# Patient Record
Sex: Female | Born: 1980 | Race: Black or African American | Hispanic: No | Marital: Married | Smoking: Former smoker
Health system: Southern US, Community
[De-identification: ages and names within clinical notes are randomized; demographics above are authoritative.]

## PROBLEM LIST (undated history)

## (undated) DIAGNOSIS — I10 Essential (primary) hypertension: Secondary | ICD-10-CM

## (undated) DIAGNOSIS — N200 Calculus of kidney: Secondary | ICD-10-CM

## (undated) DIAGNOSIS — R51 Headache: Secondary | ICD-10-CM

## (undated) DIAGNOSIS — D649 Anemia, unspecified: Secondary | ICD-10-CM

## (undated) DIAGNOSIS — B373 Candidiasis of vulva and vagina: Secondary | ICD-10-CM

## (undated) DIAGNOSIS — D72829 Elevated white blood cell count, unspecified: Secondary | ICD-10-CM

## (undated) DIAGNOSIS — B3731 Acute candidiasis of vulva and vagina: Secondary | ICD-10-CM

## (undated) DIAGNOSIS — K219 Gastro-esophageal reflux disease without esophagitis: Secondary | ICD-10-CM

## (undated) DIAGNOSIS — A499 Bacterial infection, unspecified: Secondary | ICD-10-CM

## (undated) DIAGNOSIS — N390 Urinary tract infection, site not specified: Secondary | ICD-10-CM

## (undated) DIAGNOSIS — F419 Anxiety disorder, unspecified: Secondary | ICD-10-CM

## (undated) HISTORY — DX: Anemia, unspecified: D64.9

## (undated) HISTORY — PX: CHOLECYSTECTOMY: SHX55

## (undated) HISTORY — PX: OTHER SURGICAL HISTORY: SHX169

## (undated) HISTORY — DX: Candidiasis of vulva and vagina: B37.3

## (undated) HISTORY — DX: Acute candidiasis of vulva and vagina: B37.31

## (undated) HISTORY — PX: DENTAL SURGERY: SHX609

## (undated) HISTORY — DX: Elevated white blood cell count, unspecified: D72.829

## (undated) HISTORY — PX: TUBAL LIGATION: SHX77

---

## 1998-11-25 ENCOUNTER — Ambulatory Visit (HOSPITAL_COMMUNITY): Admission: RE | Admit: 1998-11-25 | Discharge: 1998-11-25 | Payer: Self-pay | Admitting: Obstetrics

## 1999-02-02 ENCOUNTER — Ambulatory Visit (HOSPITAL_COMMUNITY): Admission: RE | Admit: 1999-02-02 | Discharge: 1999-02-02 | Payer: Self-pay | Admitting: *Deleted

## 1999-03-29 ENCOUNTER — Ambulatory Visit (HOSPITAL_COMMUNITY): Admission: RE | Admit: 1999-03-29 | Discharge: 1999-03-29 | Payer: Self-pay | Admitting: *Deleted

## 1999-04-06 ENCOUNTER — Inpatient Hospital Stay (HOSPITAL_COMMUNITY): Admission: AD | Admit: 1999-04-06 | Discharge: 1999-04-06 | Payer: Self-pay | Admitting: *Deleted

## 1999-04-11 ENCOUNTER — Inpatient Hospital Stay (HOSPITAL_COMMUNITY): Admission: AD | Admit: 1999-04-11 | Discharge: 1999-04-11 | Payer: Self-pay | Admitting: Obstetrics & Gynecology

## 1999-04-14 ENCOUNTER — Inpatient Hospital Stay (HOSPITAL_COMMUNITY): Admission: AD | Admit: 1999-04-14 | Discharge: 1999-04-14 | Payer: Self-pay | Admitting: Obstetrics

## 1999-04-18 ENCOUNTER — Inpatient Hospital Stay (HOSPITAL_COMMUNITY): Admission: AD | Admit: 1999-04-18 | Discharge: 1999-04-18 | Payer: Self-pay | Admitting: Obstetrics & Gynecology

## 1999-04-26 ENCOUNTER — Inpatient Hospital Stay (HOSPITAL_COMMUNITY): Admission: AD | Admit: 1999-04-26 | Discharge: 1999-04-26 | Payer: Self-pay | Admitting: *Deleted

## 1999-05-06 ENCOUNTER — Inpatient Hospital Stay (HOSPITAL_COMMUNITY): Admission: AD | Admit: 1999-05-06 | Discharge: 1999-05-06 | Payer: Self-pay | Admitting: Obstetrics & Gynecology

## 1999-05-08 ENCOUNTER — Inpatient Hospital Stay (HOSPITAL_COMMUNITY): Admission: AD | Admit: 1999-05-08 | Discharge: 1999-05-08 | Payer: Self-pay | Admitting: *Deleted

## 1999-05-11 ENCOUNTER — Inpatient Hospital Stay (HOSPITAL_COMMUNITY): Admission: AD | Admit: 1999-05-11 | Discharge: 1999-05-11 | Payer: Self-pay | Admitting: *Deleted

## 1999-05-13 ENCOUNTER — Encounter (HOSPITAL_COMMUNITY): Admission: RE | Admit: 1999-05-13 | Discharge: 1999-05-14 | Payer: Self-pay | Admitting: Obstetrics & Gynecology

## 1999-05-13 ENCOUNTER — Inpatient Hospital Stay (HOSPITAL_COMMUNITY): Admission: AD | Admit: 1999-05-13 | Discharge: 1999-05-16 | Payer: Self-pay | Admitting: Obstetrics & Gynecology

## 1999-05-18 ENCOUNTER — Inpatient Hospital Stay (HOSPITAL_COMMUNITY): Admission: AD | Admit: 1999-05-18 | Discharge: 1999-05-21 | Payer: Self-pay | Admitting: *Deleted

## 1999-11-15 ENCOUNTER — Encounter: Payer: Self-pay | Admitting: Emergency Medicine

## 1999-11-15 ENCOUNTER — Emergency Department (HOSPITAL_COMMUNITY): Admission: EM | Admit: 1999-11-15 | Discharge: 1999-11-15 | Payer: Self-pay | Admitting: Emergency Medicine

## 2001-03-20 ENCOUNTER — Other Ambulatory Visit: Admission: RE | Admit: 2001-03-20 | Discharge: 2001-03-20 | Payer: Self-pay | Admitting: *Deleted

## 2001-08-06 ENCOUNTER — Emergency Department (HOSPITAL_COMMUNITY): Admission: EM | Admit: 2001-08-06 | Discharge: 2001-08-06 | Payer: Self-pay | Admitting: *Deleted

## 2002-11-12 ENCOUNTER — Other Ambulatory Visit: Admission: RE | Admit: 2002-11-12 | Discharge: 2002-11-12 | Payer: Self-pay | Admitting: Obstetrics and Gynecology

## 2003-03-27 ENCOUNTER — Inpatient Hospital Stay (HOSPITAL_COMMUNITY): Admission: AD | Admit: 2003-03-27 | Discharge: 2003-03-27 | Payer: Self-pay | Admitting: Obstetrics and Gynecology

## 2003-06-12 ENCOUNTER — Inpatient Hospital Stay (HOSPITAL_COMMUNITY): Admission: AD | Admit: 2003-06-12 | Discharge: 2003-06-12 | Payer: Self-pay | Admitting: *Deleted

## 2003-06-22 ENCOUNTER — Encounter (INDEPENDENT_AMBULATORY_CARE_PROVIDER_SITE_OTHER): Payer: Self-pay | Admitting: Specialist

## 2003-06-22 ENCOUNTER — Inpatient Hospital Stay (HOSPITAL_COMMUNITY): Admission: AD | Admit: 2003-06-22 | Discharge: 2003-06-24 | Payer: Self-pay | Admitting: Obstetrics & Gynecology

## 2003-07-30 ENCOUNTER — Other Ambulatory Visit: Admission: RE | Admit: 2003-07-30 | Discharge: 2003-07-30 | Payer: Self-pay | Admitting: Obstetrics and Gynecology

## 2003-12-13 ENCOUNTER — Emergency Department (HOSPITAL_COMMUNITY): Admission: EM | Admit: 2003-12-13 | Discharge: 2003-12-14 | Payer: Self-pay | Admitting: Emergency Medicine

## 2004-02-10 ENCOUNTER — Ambulatory Visit (HOSPITAL_COMMUNITY): Admission: RE | Admit: 2004-02-10 | Discharge: 2004-02-10 | Payer: Self-pay | Admitting: Gastroenterology

## 2004-02-25 ENCOUNTER — Emergency Department (HOSPITAL_COMMUNITY): Admission: EM | Admit: 2004-02-25 | Discharge: 2004-02-25 | Payer: Self-pay | Admitting: Emergency Medicine

## 2004-06-28 ENCOUNTER — Ambulatory Visit (HOSPITAL_COMMUNITY): Admission: RE | Admit: 2004-06-28 | Discharge: 2004-06-28 | Payer: Self-pay | Admitting: Family Medicine

## 2004-10-06 ENCOUNTER — Encounter (INDEPENDENT_AMBULATORY_CARE_PROVIDER_SITE_OTHER): Payer: Self-pay | Admitting: Specialist

## 2004-10-07 ENCOUNTER — Inpatient Hospital Stay (HOSPITAL_COMMUNITY): Admission: EM | Admit: 2004-10-07 | Discharge: 2004-10-08 | Payer: Self-pay | Admitting: Emergency Medicine

## 2004-10-26 ENCOUNTER — Ambulatory Visit (HOSPITAL_COMMUNITY): Admission: RE | Admit: 2004-10-26 | Discharge: 2004-10-26 | Payer: Self-pay | Admitting: Gastroenterology

## 2004-10-26 ENCOUNTER — Inpatient Hospital Stay (HOSPITAL_COMMUNITY): Admission: AD | Admit: 2004-10-26 | Discharge: 2004-10-29 | Payer: Self-pay | Admitting: Gastroenterology

## 2005-11-27 ENCOUNTER — Emergency Department (HOSPITAL_COMMUNITY): Admission: EM | Admit: 2005-11-27 | Discharge: 2005-11-27 | Payer: Self-pay | Admitting: Emergency Medicine

## 2006-11-17 ENCOUNTER — Emergency Department (HOSPITAL_COMMUNITY): Admission: EM | Admit: 2006-11-17 | Discharge: 2006-11-17 | Payer: Self-pay | Admitting: Emergency Medicine

## 2007-05-28 ENCOUNTER — Emergency Department (HOSPITAL_COMMUNITY): Admission: EM | Admit: 2007-05-28 | Discharge: 2007-05-28 | Payer: Self-pay | Admitting: Emergency Medicine

## 2009-01-21 ENCOUNTER — Inpatient Hospital Stay (HOSPITAL_COMMUNITY): Admission: AD | Admit: 2009-01-21 | Discharge: 2009-01-21 | Payer: Self-pay | Admitting: Obstetrics and Gynecology

## 2009-02-12 ENCOUNTER — Ambulatory Visit (HOSPITAL_COMMUNITY): Admission: RE | Admit: 2009-02-12 | Discharge: 2009-02-12 | Payer: Self-pay | Admitting: Obstetrics and Gynecology

## 2009-03-12 ENCOUNTER — Ambulatory Visit (HOSPITAL_COMMUNITY): Admission: RE | Admit: 2009-03-12 | Discharge: 2009-03-12 | Payer: Self-pay | Admitting: Obstetrics and Gynecology

## 2009-04-09 ENCOUNTER — Encounter: Admission: RE | Admit: 2009-04-09 | Discharge: 2009-07-08 | Payer: Self-pay | Admitting: Obstetrics and Gynecology

## 2009-07-16 ENCOUNTER — Inpatient Hospital Stay (HOSPITAL_COMMUNITY): Admission: AD | Admit: 2009-07-16 | Discharge: 2009-07-19 | Payer: Self-pay | Admitting: Obstetrics and Gynecology

## 2009-07-17 ENCOUNTER — Encounter (INDEPENDENT_AMBULATORY_CARE_PROVIDER_SITE_OTHER): Payer: Self-pay | Admitting: Obstetrics and Gynecology

## 2010-04-03 ENCOUNTER — Encounter: Payer: Self-pay | Admitting: Emergency Medicine

## 2010-04-24 IMAGING — US US OB FOLLOW-UP
1 series · 14 of 28 positions shown · non-contrast
Comparison: none

OBSTETRICAL ULTRASOUND:
 This ultrasound was performed in The [HOSPITAL], and the [facility] report will be stored to [REDACTED] PACS.  This report is also available in [HOSPITAL]?s accessANYware.

[Series 1: us ob follow-up · 58 acquisitions, 14 frames shown]
[im 3/58]
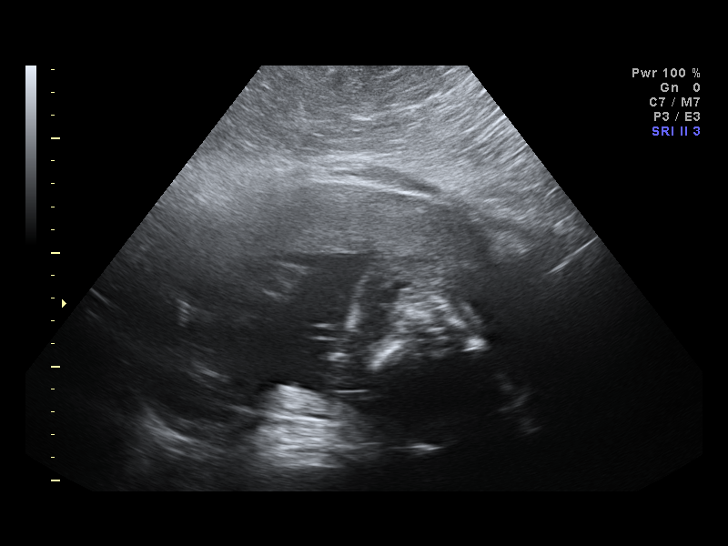
[im 7/58]
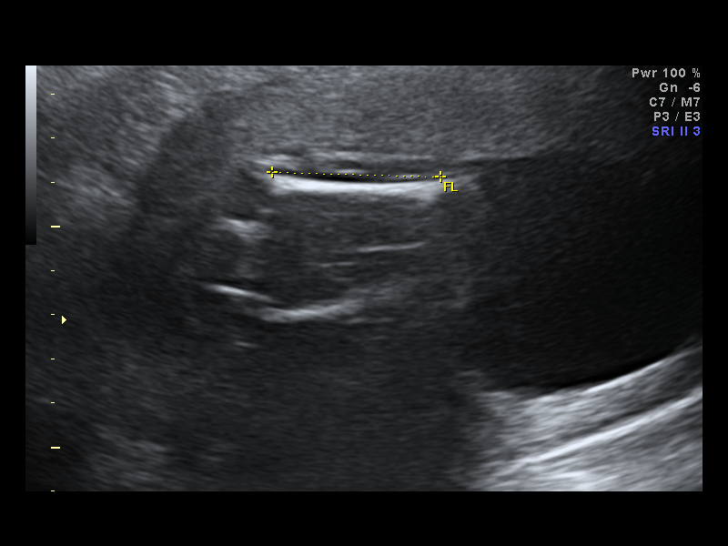
[im 11/58]
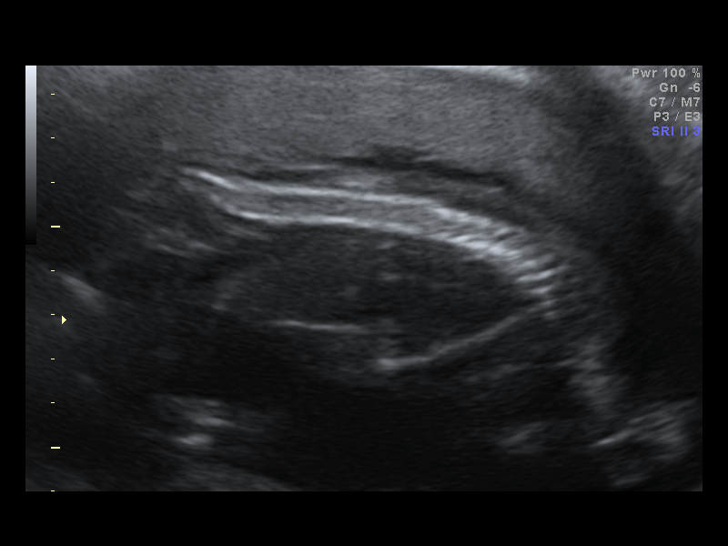
[im 15/58]
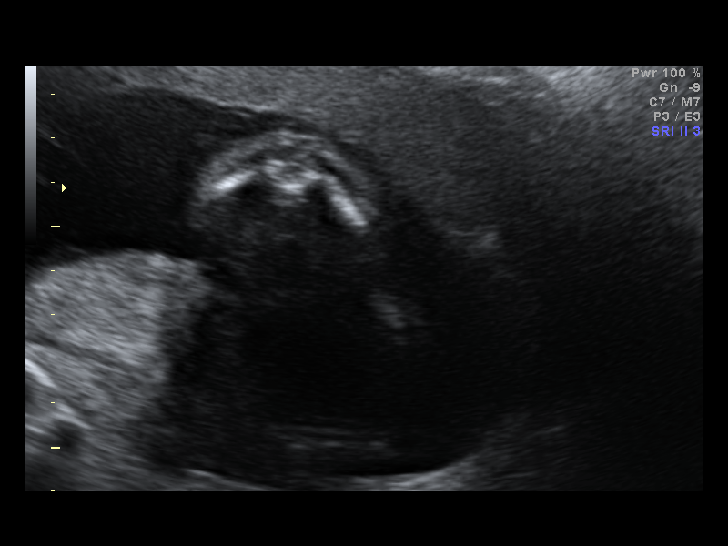
[im 20/58]
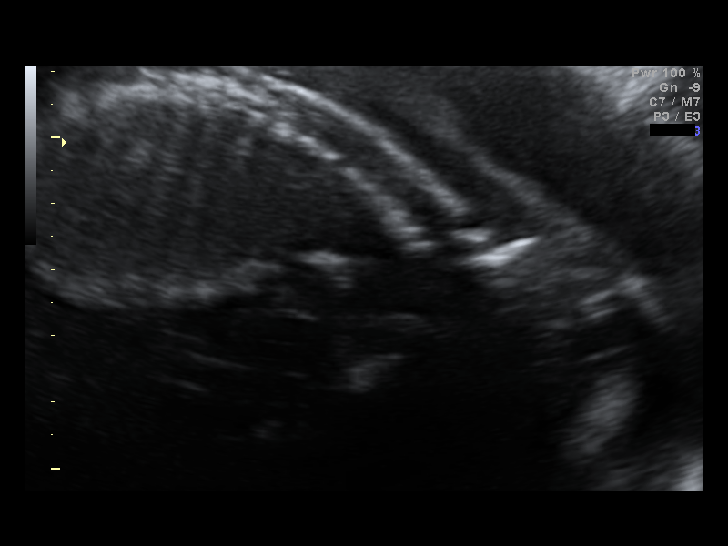
[im 24/58]
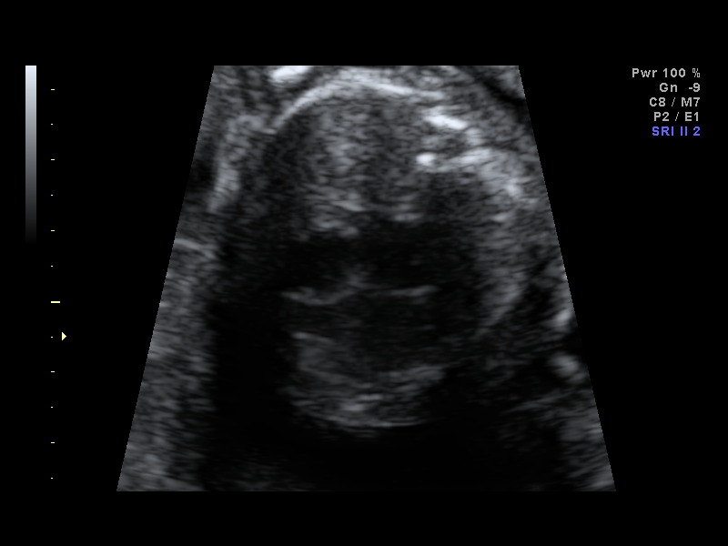
[im 28/58]
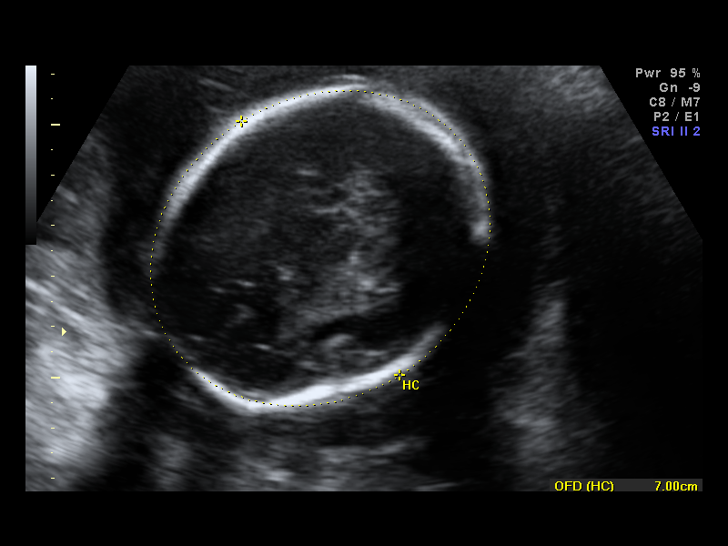
[im 32/58]
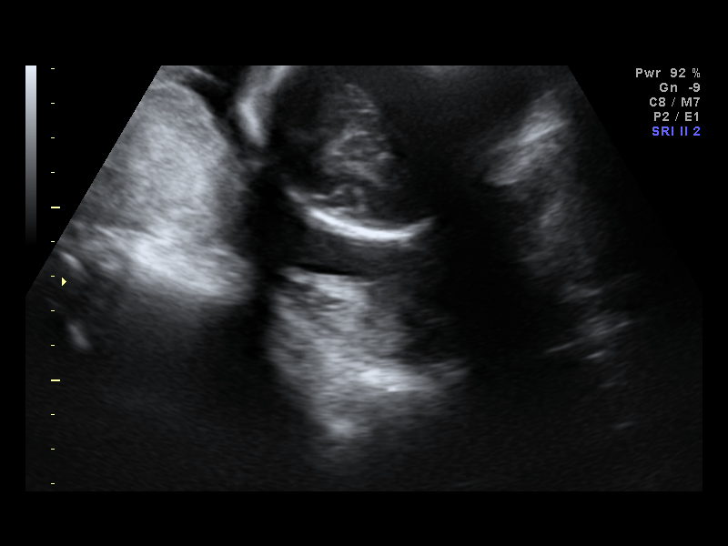
[im 36/58]
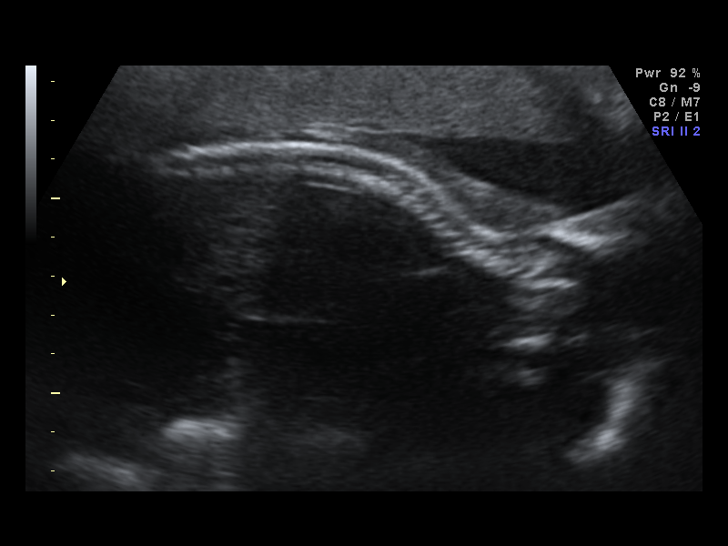
[im 41/58]
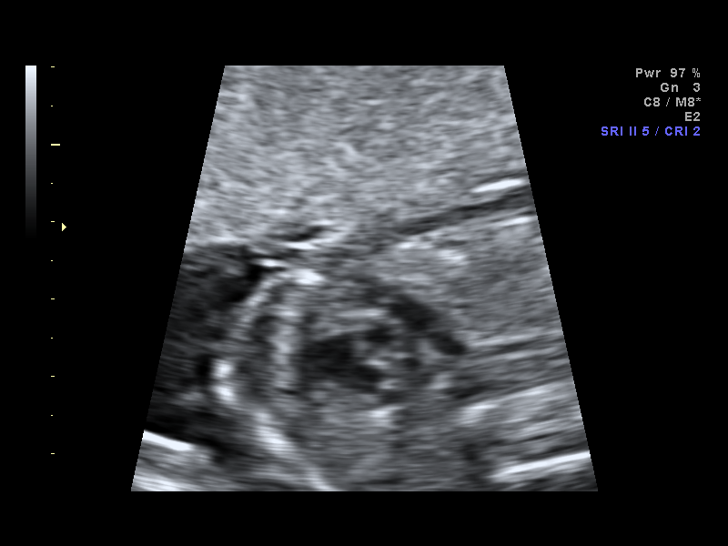
[im 45/58]
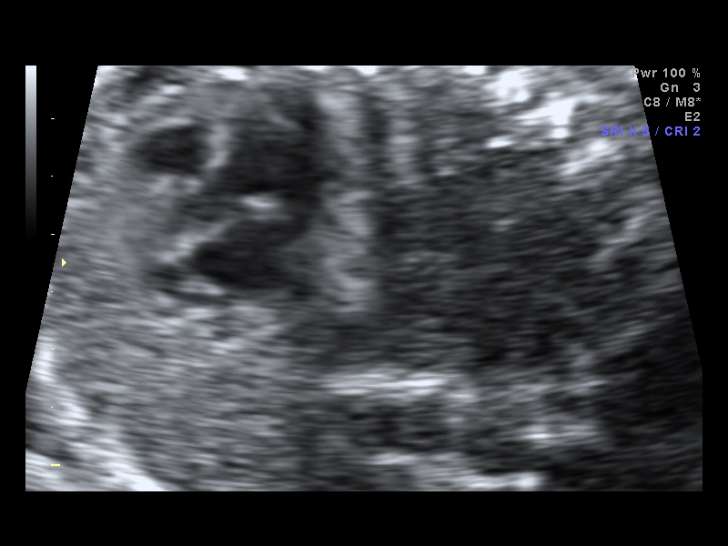
[im 49/58]
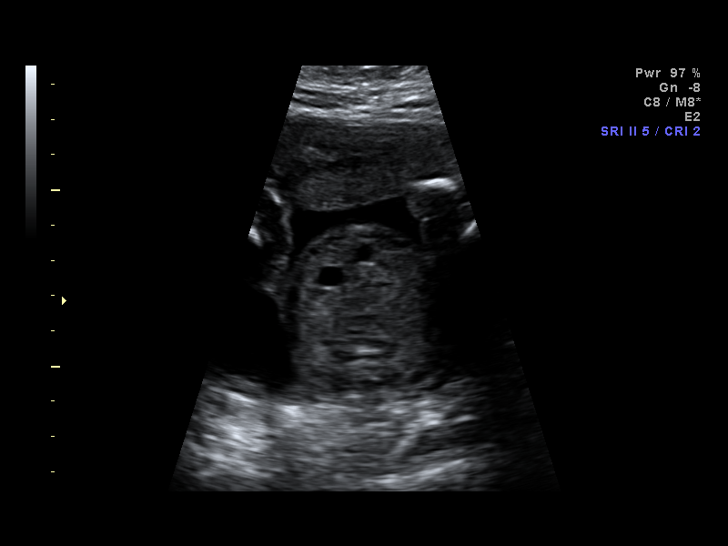
[im 53/58]
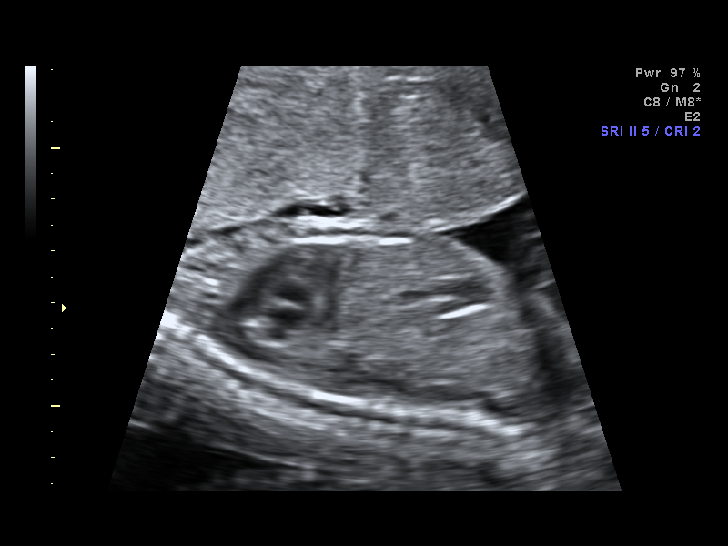
[im 58/58]
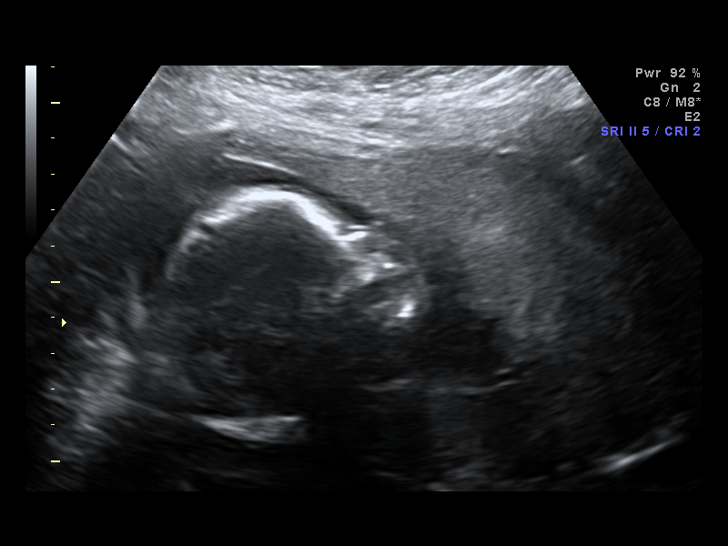

[14 of 28 positions shown; findings below may reference images not displayed]

IMPRESSION: [facility] has also been faxed to the ordering physician.

## 2010-06-01 LAB — COMPREHENSIVE METABOLIC PANEL
ALT: 15 U/L (ref 0–35)
ALT: 18 U/L (ref 0–35)
AST: 23 U/L (ref 0–37)
Alkaline Phosphatase: 98 U/L (ref 39–117)
BUN: 5 mg/dL — ABNORMAL LOW (ref 6–23)
BUN: 7 mg/dL (ref 6–23)
CO2: 21 mEq/L (ref 19–32)
CO2: 25 mEq/L (ref 19–32)
Calcium: 7.1 mg/dL — ABNORMAL LOW (ref 8.4–10.5)
Chloride: 103 mEq/L (ref 96–112)
Chloride: 106 mEq/L (ref 96–112)
Chloride: 108 mEq/L (ref 96–112)
Creatinine, Ser: 0.56 mg/dL (ref 0.4–1.2)
Creatinine, Ser: 0.6 mg/dL (ref 0.4–1.2)
GFR calc Af Amer: >60 mL/min (ref >60)
GFR calc non Af Amer: >60 mL/min (ref >60)
GFR calc non Af Amer: >60 mL/min (ref >60)
Glucose, Bld: 106 mg/dL — ABNORMAL HIGH (ref 70–99)
Glucose, Bld: 73 mg/dL (ref 70–99)
Potassium: 3.5 mEq/L (ref 3.5–5.1)
Sodium: 133 mEq/L — ABNORMAL LOW (ref 135–145)
Sodium: 135 mEq/L (ref 135–145)
Total Bilirubin: 0.2 mg/dL — ABNORMAL LOW (ref 0.3–1.2)
Total Bilirubin: 0.3 mg/dL (ref 0.3–1.2)
Total Bilirubin: 0.4 mg/dL (ref 0.3–1.2)
Total Protein: 4.7 g/dL — ABNORMAL LOW (ref 6.0–8.3)

## 2010-06-01 LAB — CBC
HCT: 29.1 % — ABNORMAL LOW (ref 36.0–46.0)
HCT: 29.7 % — ABNORMAL LOW (ref 36.0–46.0)
HCT: 37.6 % (ref 36.0–46.0)
Hemoglobin: 12.8 g/dL (ref 12.0–15.0)
Hemoglobin: 12.9 g/dL (ref 12.0–15.0)
Hemoglobin: 9.9 g/dL — ABNORMAL LOW (ref 12.0–15.0)
MCHC: 33.7 g/dL (ref 30.0–36.0)
MCV: 89.2 fL (ref 78.0–100.0)
MCV: 89.7 fL (ref 78.0–100.0)
Platelets: 155 10*3/uL (ref 150–400)
Platelets: 163 10*3/uL (ref 150–400)
Platelets: 202 10*3/uL (ref 150–400)
RBC: 3.31 MIL/uL — ABNORMAL LOW (ref 3.87–5.11)
RBC: 4.21 MIL/uL (ref 3.87–5.11)
RBC: 4.22 MIL/uL (ref 3.87–5.11)
RDW: 16.3 % — ABNORMAL HIGH (ref 11.5–15.5)
WBC: 14 10*3/uL — ABNORMAL HIGH (ref 4.0–10.5)
WBC: 19 10*3/uL — ABNORMAL HIGH (ref 4.0–10.5)
WBC: 19.1 10*3/uL — ABNORMAL HIGH (ref 4.0–10.5)
WBC: 20.1 10*3/uL — ABNORMAL HIGH (ref 4.0–10.5)

## 2010-06-01 LAB — URINALYSIS, ROUTINE W REFLEX MICROSCOPIC
Glucose, UA: NEGATIVE mg/dL
Ketones, ur: NEGATIVE mg/dL
Protein, ur: NEGATIVE mg/dL

## 2010-06-01 LAB — CREATININE, URINE, 24 HOUR
Collection Interval-UCRE24: 24 hours
Creatinine, 24H Ur: 1657 mg/d (ref 700–1800)

## 2010-06-01 LAB — LACTATE DEHYDROGENASE
LDH: 124 U/L (ref 94–250)
LDH: 144 U/L (ref 94–250)

## 2010-06-01 LAB — PROTEIN, URINE, 24 HOUR
Protein, 24H Urine: 312 mg/d — ABNORMAL HIGH (ref 50–100)
Protein, Urine: 14 mg/dL

## 2010-06-01 LAB — URIC ACID: Uric Acid, Serum: 4.1 mg/dL (ref 2.4–7.0)

## 2010-06-01 LAB — URINE MICROSCOPIC-ADD ON

## 2010-06-16 LAB — WET PREP, GENITAL
Clue Cells Wet Prep HPF POC: NONE SEEN
Trich, Wet Prep: NONE SEEN
Yeast Wet Prep HPF POC: NONE SEEN

## 2010-06-16 LAB — URINE CULTURE

## 2010-06-16 LAB — URINALYSIS, ROUTINE W REFLEX MICROSCOPIC
Ketones, ur: NEGATIVE mg/dL
Nitrite: NEGATIVE
Protein, ur: NEGATIVE mg/dL
Urobilinogen, UA: 0.2 mg/dL (ref 0.0–1.0)

## 2010-06-16 LAB — URINE MICROSCOPIC-ADD ON

## 2010-07-30 NOTE — Discharge Summary (Signed)
[facility]  Patient:    Lindsay Pineda, Lindsay Pineda                    MRN: [id number] Adm. Date:  [id number] Disc. Date: [id number] Attending:  Michaelle Copas Dictator:   Brandt Loosen, M.D.                           Discharge Summary  DISCHARGE DIAGNOSES:          Undelivered intrauterine pregnancy at 40-4/7 weeks.  DISCHARGE MEDICATIONS:        Prenatal vitamins.  Routine labor instructions.  HOSPITAL COURSE:              Intrauterine pregnancy at 40-4/7 weeks.  The patient was admitted with elevated blood pressures.  Uric acid of 5.2.  She was begun on low dose Pitocin for induction of labor for PIH.  She was then placed upon Cytotec overnight.  There was no change in her cervix.  PIH labs were followed and remained within normal limits.  The patient desired to go home and return for follow-up induction.  NST was reassuring.  The patient was discharged to home with routine follow-up and care. DD:  08/27/99 TD:  08/31/99 Job: [id number]

## 2010-07-30 NOTE — Op Note (Signed)
NAME:  Lindsay Pineda, Lindsay Pineda         ACCOUNT NO.:  [id number]   MEDICAL RECORD NO.:  [id number]          PATIENT TYPE:  AMB   LOCATION:  ENDO                         FACILITY:  [facility]   PHYSICIAN:  [place] Hawks. Elnoria Howard, MD    DATE OF BIRTH:  05-29-1980   DATE OF PROCEDURE:  02/10/2004  DATE OF DISCHARGE:                                 OPERATIVE REPORT   PROCEDURE:  Colonoscopy.   INDICATION:  Hematochezia and abdominal pain as well as a strong family  history of colon cancer.   REFERRING PHYSICIAN:  Lenoard Aden, M.D.   ENDOSCOPIST:  [place] Hawks. Elnoria Howard, M.D.   EQUIPMENT USED:  Adult Olympus colonoscope.   CONSENT:  Informed consent was obtained from the patient describing the  risks of bleeding, infection, perforation, medication reactions, the 10%  miss rate for a small colon cancer or polyp, and the risk of death, all of  which are not exclusive of any other complications that may occur.   PHYSICAL EXAMINATION:  CARDIAC:  Regular rate and rhythm.  LUNGS:  Clear to auscultation bilaterally.  ABDOMEN:  Obese, soft, nontender, nondistended.   MEDICATIONS:  Versed 10 mg IV, Demerol 100 mg IV.   DESCRIPTION OF PROCEDURE:  After adequate sedation was achieved with  medications, a rectal examination was performed which was negative for any  palpable abnormality.  The colonoscope was then introduced from the anus and  advanced under direct visualization to the terminal ileum without  difficulty.  The patient was noted to have an excellent prep;  however,  within the sigmoid colon, this was improved by multiple washings, and  excellent views of the mucosa were obtained.  Upon slow withdrawal from the  cecum, there was no evidence of any polyps, masses, inflammation,  ulcerations, erosions, diverticula, or vascular abnormalities in the cecum,  ascending, transverse, descending, or sigmoid colon.  Retroflexion in the  rectum did reveal external hemorrhoids which were moderate in size.   Photo  documentation of the cecum and terminal ileum was also obtained.  The  colonoscope was then straightened and withdrawn from the patient, and the  procedure was terminated.  The patient also tolerated the procedure well.   PLAN:  1.  Repeat colonoscopy in five years.  2.  Follow up in clinic as previously scheduled.  3.  Continue with a high fiber diet.       PDH/MEDQ  D:  02/10/2004  T:  02/10/2004  Job:  [id number]   cc:   Lenoard Aden, M.D.  [address]  [place]  [place] [address]  Fax: [phone]

## 2010-07-30 NOTE — H&P (Signed)
NAMEMarland Pineda  Lindsay, [name] NO.:  [id number]   MEDICAL RECORD NO.:  [id number]          PATIENT TYPE:  INP   LOCATION:  5707                         FACILITY:  [facility]   PHYSICIAN:  Hillery Aldo, M.D.   DATE OF BIRTH:  Jul 04, 1980   DATE OF ADMISSION:  10/26/2004  DATE OF DISCHARGE:                                HISTORY & PHYSICAL   PRIMARY CARE PHYSICIAN:  Lindsay Pineda. [place] Pineda, M.D.   GASTROENTEROLOGIST:  Anselmo Rod, M.D.   CHIEF COMPLAINT:  Back pain and right flank pain.   HISTORY OF PRESENT ILLNESS:  The patient is a 30 year old female with a  history of cholelithiasis status post cholecystectomy on October 06, 2004, and  now status post ERCP x2, the last one being today.  The patient states she  had a stent placed by Dr. Elnoria Howard.  She has had pain unrelieved by Vicodin and  is admitted for pain control and further workup of her abdominal pain.  The  patient claims she is also nauseated but denies any vomiting.  Denies any  fever or chills.  No changes in her appetite.  No changes in her bowel  habits.  No melena or hematochezia.   PAST MEDICAL HISTORY:  1.  Cholelithiasis status post cholecystectomy on October 06, 2004, status post      ERCP x2 with stent placement.  2.  Obesity.  3.  Tobacco abuse.   ALLERGIES:  No known drug allergies.   MEDICATIONS:  Yasmin daily.   SOCIAL HISTORY:  The patient is married.  She is employed as a Public librarian at [employer].  She has completed some  college.  She smokes 1/2 pack cigarettes daily for 9 years and uses alcohol  on social occasions.  Denies any street drugs.   FAMILY HISTORY:  No history of gallbladder disease in the family.  She has a  grandfather who has colon cancer.  Her mother is alive and well.  Her father  is alive and has diabetes.   REVIEW OF SYSTEMS:  As per HPI.  Otherwise, no dysuria, no chest pain, no  shortness of breath.  She does have occasional cough that is  nonproductive.  She has had steady weight gain over time.  All systems otherwise reviewed  and found to be negative.   PHYSICAL EXAMINATION:  VITAL SIGNS: Temperature 99.1, pulse 70, respirations  20, blood pressure 126/79, O2 saturation 98% on room air.  GENERAL:  Obese female in no apparent distress.  HEENT:  Normocephalic and atraumatic.  PERRL.  EOMI.  Oropharynx is clear.  She does have a tongue piercing.  NECK:  Supple, no thyromegaly, no lymphadenopathy, no jugular venous  distention.  CHEST: Lungs clear to auscultation bilaterally with good air movement.  HEART:  Regular rate and rhythm.  No murmurs, rubs, or gallops.  ABDOMEN: Soft, nontender, nondistended, with normoactive bowel sounds in all  four quadrants.  EXTREMITIES:  No edema, cyanosis, clubbing.  SKIN:  No rashes.  NEUROLOGIC:  Nonfocal.   LABORATORY DATA:  Pending at time of this dictation.   ASSESSMENT AND PLAN:  1.  Abdominal  pain status post endoscopic retrograde      cholangiopancreatography with stent placement.  Will check CMET, CBC, as      well as lipase to rule out pancreatitis, infection, or other obstructive      pathology.  Will also get a CT scan of the abdomen and pelvis for a      better look into her anatomy.  In the meantime, will achieve pain      control with Percocet and, if needed, IV morphine and antiemetics.  Dr.      Loreta Ave is aware of the patient's admission, and she will be seen by      gastroenterology in the morning.  2.  Tobacco abuse.  Will encourage tobacco cessation with appropriate      teaching.           ______________________________  Hillery Aldo, M.D.     CR/MEDQ  D:  10/26/2004  T:  10/26/2004  Job:  [id number]   cc:   Lindsay Pineda. Lindsay Pineda, M.D.  Fax: [phone]   [id number] NWG NFAO, M.D.  [address]  [address]  [place]  [place] [address]  Fax: [phone]

## 2010-07-30 NOTE — H&P (Signed)
NAME:  ARMONII, SIEH NO.:  [id number]   MEDICAL RECORD NO.:  [id number]                   PATIENT TYPE:  INP   LOCATION:  9163                                 FACILITY:  [facility]   PHYSICIAN:  Richardean Sale, M.D.                DATE OF BIRTH:  21-Mar-1980   DATE OF ADMISSION:  06/22/2003  DATE OF DISCHARGE:                                HISTORY & PHYSICAL   CHIEF COMPLAINT:  Contractions.   HISTORY OF PRESENT ILLNESS:  A 30 year old gravida 2, para 1 African-  American female at 39-6/[redacted] weeks gestation with [facility] of June 23, 2003  presents to Labor and Delivery complaining of contractions.  The patient  rates her pain as an 8 out of 10.  Pregnancy has been complicated by fundal  height discrepancy.  Last ultrasound per the patient's report was estimated  fetal weight of 7 pounds 2 to 3 weeks ago.  At 33 weeks, estimated fetal  weight was 2262 grams which was 55th percentile.  Pregnancy has otherwise  been uncomplicated.  The patient was scheduled for an induction tomorrow  morning.   PAST OBSTETRIC HISTORY:  Significant for a macrosomic fetus weighting over 9-  1/2 pounds with a successful vaginal delivery.  She also has a history of  oligohydramnios, pregnancy-induced hypertension, and positive group B beta  strep in her last pregnancy.  The patient reports good fetal movement,  denies loss of fluid or vaginal bleeding at the present.   PAST MEDICAL HISTORY:  History of pyelonephritis as a child.   PAST SURGICAL HISTORY:  None.   OBSTETRIC HISTORY:  Vaginal delivery x1.  Infant weighing over 9-1/2 pounds  at [redacted] weeks gestation.  Pregnancy complicated by positive group B beta  strep, pregnancy-induced hypertension, and oligohydramnios.   PAST GYNECOLOGICAL HISTORY:  Positive history of Trichomonas.  Denies any  history of herpes.  Menses began at age 47, have always been irregular,  lasts 3 to 7 days.  Estimated due date was determined by ultrasound  given  history of irregular cycles.   PHYSICAL EXAMINATION:  VITAL SIGNS:  She is afebrile.  Blood pressure is  138/85, all other vital signs are stable.  GENERAL:  She is an obese African-American female who appears uncomfortable  with contractions but is in no acute distress.  HEART:  Regular rate and rhythm, 3/6 systolic ejection murmur noted.  LUNGS:  Clear to auscultation bilaterally.  ABDOMEN:  Fundal height is 42.  Abdomen is gravid and nontender.  Contractions palpates moderate.  EXTREMITIES:  Trace edema bilaterally, no cyanosis or clubbing.  Nontender.  PELVIC:  Cervix is 4 to 5 cm dilated, 80% effaced, minus 1 station and  vertex.  FETAL MONITORING:  Fetal heart tones 140s to 150s.  No decelerations noted.  Very difficult to trace due the patient's moving around in bed with  contractions and the patient's habitus.  Tocometer contractions  approximately every 3  to 5 minutes.  Again, difficult to trace due to the  patient's habitus.   PRENATAL LABORATORY DATA:  Group B beta strep unknown.  HIV nonreactive.  Triple test within normal limits.  Blood type O positive, antibody screen  negative, RPR nonreactive, rubella immune, hepatitis B surface antigen  negative.  One-hour Glucola within normal limits.  The patient's value not  recorded on the patient's prenatal forms.  The patient denies having a three-  hour glucose tolerance test.   ASSESSMENT:  A 30 year old gravida 2, para 1 black female who is at 39-6/[redacted]  weeks gestation in labor.   PLAN:  1. Admit to Labor and Delivery.  2. Continuous fetal monitoring.  3. The patient may have Stadol for pain relief and for epidural.  4. Unknown group B beta strep status with history of group B strep in     previous pregnancy.  Will treat with penicillin for prophylaxis and     perform amniotomy after penicillin has been administered unless fetal     heart rate tracing is nonreassuring and a scalp electrode needs to be     placed.   5. History of macrosomic infant.  The patient reports that her one-hour     Glucola test was within normal limits.  She had an ultrasound     approximately 2 to 3 weeks ago where she said the baby was 7 pounds.  She     has a history of a 9-1/2 pound infant.  Discussed with the patient the     possibility of shoulder dystocia, given both the estimated size of the     infant, her past history, as well as the patient's habitus.  Explained to     her that with a shoulder dystocia, there is a possibility of a brachial     plexus injury or possibility of hypoxemia to the fetus that could result     in (1) permanent nerve damage and paralysis of the arm, or if hypoxia     occurs (2) possibility of brain damage and cerebral palsy, (3) also     possibility of fourth-degree episiotomy or lacerations with resulting     difficulty with bowel movements.  The patient expresses understanding of     the risks at the time of admission.                                               Richardean Sale, M.D.    JW/MEDQ  D:  06/22/2003  T:  06/22/2003  Job:  [id number]

## 2010-07-30 NOTE — Consult Note (Signed)
NAMEMarland Kitchen  Lindsay Pineda NO.:  [id number]   MEDICAL RECORD NO.:  [id number]          PATIENT TYPE:  INP   LOCATION:  5707                         FACILITY:  [facility]   PHYSICIAN:  [place] Hawks. Elnoria Howard, MD    DATE OF BIRTH:  06-07-1980   DATE OF CONSULTATION:  10/27/2004  DATE OF DISCHARGE:                                   CONSULTATION   REFERRING TEAM:  [facility] Team.   REASON FOR CONSULTATION:  Abdominal pain, status post ERCP.   ALLERGIES:  No known drug allergies.   MEDICATIONS:  Jasmine daily.   SOCIAL HISTORY:  The patient is married, has a smoking history of 1-1/2  packs per day x9 years.  She has social alcohol use.  No illicit drug use.   FAMILY HISTORY:  Significant for colon cancer in her grandfather and  diabetes.   REVIEW OF SYSTEMS:  Significant for abdominal pain and nausea.  No fevers,  chills, vomiting.  No hematemesis, dysuria or dysarthria, rashes, arthritis,  arthralgias.   HISTORY OF PRESENT ILLNESS:  This is a 30 year old black female with a past  medical history of laparoscopic cholecystectomy approximately 1 month ago  for acute cholecystitis and with retained CBD stones, status post ERCP x2.  The patient initially underwent an ERCP by myself approximately 1 month ago;  however, because of her anatomy, it was difficult to remove the stones.  A  biliary stent was placed in at that time and plans for a repeat ERCP to  extend the sphincterotomy and re-attempt to extract the stones.  During the  second ERCP, it was noted that the patient's distal CBD had an acute angle  which prohibited the clearance of the CBD stones.  Multiple attempts were  made with the balloons and using a wire basket to crush the stones;  unfortunately, these attempts failed.  A replacement stent was placed in the  same size, 10-French, x5 cm into the CBD, which was confirmed to be in  excellent placement, both fluoroscopically and endoscopically.  The  procedure was  terminated without complications and the patient recovered  without any event; however, after being at home for approximately 3-4 hours,  the patient called stating that she had persistent right upper quadrant pain  that also radiated to back.  No reports of any vomiting or nausea.  It was  felt that the patient needed an admission to the hospital for further  evaluation.   PHYSICAL EXAMINATION:  VITAL SIGNS:  Blood pressure is 122/78, heart rate  62, respirations 18, temperature is 97.7, pulse oximetry is 99% on room air.  GENERAL:  The patient is in no acute distress, alert and oriented.  HEENT:  Normocephalic, atraumatic.  Extraocular muscles intact.  Pupils are  equal, round and reactive to light.  NECK:  Supple with no lymphadenopathy.  LUNGS:  Clear to auscultation bilaterally.  CARDIOVASCULAR:  Regular rate and rhythm without murmurs, gallops or rubs.  ABDOMEN:  Obese, soft, tender in the right upper quadrant as well as the  midepigastric region.  No evidence of any rebound or abdominal rigidity.  EXTREMITIES:  No clubbing,  cyanosis, or edema.   LABORATORY VALUES ON ADMISSION:  White blood cell count 17,000, hemoglobin  is 11.8, platelets at 317,000.  Sodium is 140, potassium 3.8, chloride 108,  CO2 28, BUN of 7, creatinine 0.6, glucose is 93.  AST is 20, ALT 21,  alkaline phosphatase 50, total bilirubin 0.5, lipase is 43, albumin is 3.1.   IMPRESSION:  1.  Right upper quadrant pain, status post endoscopic retrograde      cholangiopancreatogram.  2.  Obesity.   After evaluation, the patient is not clear where her source of pain may be  originating.  The patient was replaced with the same size stent as the  initial endoscopic retrograde cholangiopancreatogram and during the  preceding months, she did not have any significant difficulties, although  she reported some minor discomfort with bending over in the right upper  quadrant region.  Placement of the stent was excellent  during the procedure.  There may be a  possibility of stent migration.  The laboratory values are  negative for any evidence of pancreatitis at this time.  The procedure was  long and it was difficult in the attempt to extract the stone and it may be  that she is experiencing some residual pain as a result of the manipulation  of the common bile duct.  I am uncertain why she has an elevated white blood  cell count; there is no evidence of any fever or clinical evidence of overt  cholangitis.  However , she does state that she has a history of elevated  white blood cell count of unknown etiology.   PLAN:  Plan at this time is to:  1.  Obtain an CT scan of the abdomen and pelvis.  2.  Continue with pain control.  3.  She will require surgical consultation in the near future for the      removal of the CBD stones surgically.      [place] Hawks Elnoria Howard, MD  Electronically Signed     PDH/MEDQ  D:  10/27/2004  T:  10/27/2004  Job:  [id number]

## 2010-07-30 NOTE — H&P (Signed)
NAME:  [place] Pineda, Lindsay Pineda NO.:  [id number]   MEDICAL RECORD NO.:  [id number]          PATIENT TYPE:  OBV   LOCATION:  1416                         FAC[facility]   PHYSICIAN:  Lorre Munroe., M.D.DATE OF BIRTH:  09-13-1980   DATE OF ADMISSION:  10/05/2004  DATE OF DISCHARGE:                                HISTORY & PHYSICAL   CHIEF COMPLAINT:  Abdominal pain.   PRESENT ILLNESS:  This is a 30 year old African-American female who has a 1-  day history of severe upper abdominal pain and protracted vomiting. She says  she vomited about 10 times. There has been no fever or diarrhea. She does  not have any chronic GI problems although she does have some reflux  symptomatology now and then. At the emergency room she was found to have a  white count of 18,000. Liver tests were normal. She is found on ultrasound  to have gallstones without evidence of acute cholecystitis, but she did have  a sonographic Murphy's sign. The patient is brought into the hospital have a  cholecystectomy. She consents to have that done.   PAST MEDICAL HISTORY:  Excellent health. Her only medicine is birth control  pills. She has no medicine allergies. She says she is not pregnant. She has  never had any operations. She has a 43-month-old child. The family history  and childhood illnesses are unremarkable. The patient smokes about half a  pack cigarettes a day. She drinks alcoholic beverages occasionally and  moderately.   FIFTEEN-POINT REVIEW OF SYSTEMS:  Unremarkable.   PHYSICAL EXAMINATION:  VITAL SIGNS:  Temperature and vital signs are as  documented by nursing report.  GENERAL:  The patient is obese. No acute distress. Mental status normal.  HEENT:  The head, neck, eyes, ears, nose, mouth and throat are unremarkable  with no enlargement of the thyroid, no thyroid nodule, no neck mass, no  scleral icterus, no abnormalities of the oral mucosa.  CHEST:  Clear to auscultation. No chest  wall tenderness present.  HEART:  Rate and rhythm normal. No murmur or gallop.  ABDOMEN:  Tender in the right upper quadrant, otherwise unremarkable with no  mass, no organomegaly. Few bowel sounds are heard. There is no hernia.  RECTAL AND PELVIC:  Not done.  EXTREMITIES:  There is no edema. There are no skin lesions and the pulses  are good.  SKIN:  No lesions noted.  LYMPH NODES:  None enlarged in the groin, axilla or neck.   IMPRESSION:  Acute cholecystitis and cholelithiasis.   PLAN:  Pain medication, IV fluids, and cholecystectomy later today.     WB/MEDQ  D:  10/06/2004  T:  10/06/2004  Job:  [id number]

## 2010-07-30 NOTE — Op Note (Signed)
NAMEMarland Pineda  Lindsay, Pineda NO.:  [id number]   MEDICAL RECORD NO.:  [id number]          PATIENT TYPE:  OBV   LOCATION:  1416                         FACILITY:  [facility]   PHYSICIAN:  Angelia Mould. Derrell Lolling, M.D.DATE OF BIRTH:  [birth year]   DATE OF PROCEDURE:  10/06/2004  DATE OF DISCHARGE:                                 OPERATIVE REPORT   PREOPERATIVE DIAGNOSES:  Acute cholecystitis with cholelithiasis.   POSTOPERATIVE DIAGNOSIS:  Acute cholecystitis with cholelithiasis,  choledocholithiasis.   OPERATION PERFORMED:  Laparoscopic cholecystectomy with intraoperative  cholangiogram.   SURGEON:  Angelia Mould. Derrell Lolling, M.D.   FIRST ASSISTANT:  Marta Lamas. Lindie Spruce, M.D.   OPERATIVE INDICATIONS:  This is a 30 year old African-American female who  presented to the [facility] emergency room with a 24-hour history of upper  abdominal pain and repeated vomiting.  Denied fever or diarrhea.  Denied chronic GI problems, although she says she has some mild reflux.  She  did not report any GI evaluations in the past.  She was evaluated by the  emergency department and found to have a white blood cell count of 18,000,  normal liver function tests, and on ultrasound found to have gallstones and  a positive sonographic Murphy sign.  She was admitted to hospital and placed  on antibiotics, and she is brought to operating room urgently for a  cholecystectomy.   OPERATIVE FINDINGS:  The gallbladder was acutely inflamed and thick-walled,  with patchy areas of either hemorrhage or gangrene.  The cystic duct was  large and thickened and had a large stone impacted, which we milked out.  The cholangiogram showed multiple medium to small common bile duct stones,  but there was no obstruction and the contrast went onto the duodenum.  The  anatomy of the intrahepatic and extrahepatic biliary tree otherwise looked  normal.  Otherwise, the stomach, liver, duodenum, small intestine, large  intestine, omentum  and the peritoneal surfaces looked normal.   OPERATIVE TECHNIQUE:  Following the induction of general endotracheal  anesthesia, the patient's abdomen was prepped and draped in sterile fashion.  Marcaine 0.5% with epinephrine was used as a local infiltration anesthetic.  A vertically-oriented incision was made just above the umbilicus.  The  fascia was incised in the midline and the abdominal cavity entered under  direct vision.  A 10 mm Hasson trocar was inserted and secured with a  pursestring suture of 0 Vicryl.  Pneumoperitoneum was created.  The video  camera was inserted with visualization and findings as described above.  A  10 mm trocar was placed in the subxiphoid region and two 5 mm trocars placed  in the right midabdomen.  A 5 mm suction trocar was used to penetrate the  fundus of the gallbladder and aspirate the bile, and this collapsed the  gallbladder enough to where we could grasp it.  We then took some adhesions  down off of the infundibulum.  We dissected out the cystic duct and the cystic artery.  We isolated the  cystic artery, secured it with multiple metal clips and divided it.  We very  carefully dissected a large window behind  the cystic duct and when we were  certain of our anatomy, we placed a clip on the cystic duct close the  gallbladder.  We finally we found a large stone in the distal cystic duct and milked that  back and out of the opening in the cystic duct.  We then placed a  cholangiogram catheter.  A cholangiogram was obtained using the C-arm.  This  showed normal intrahepatic and extrahepatic biliary anatomy, a somewhat  dilated system, good flow of contrast into the duodenum, with three or four  medium and small common bile duct stones.  The cholangiogram catheter was  removed.  The cystic duct was secured with two Endoloop ties of 0 Vicryl.  This was a very secure closure.  The gallbladder was dissected from its bed  with electrocautery, placed in a  specimen bag and removed.  The operative  field was irrigated with saline.  At the completion of the case, was no  bleeding and no bile leak whatsoever.  The trocars were removed under direct  vision, and there was no bleeding from the trocar sites.  Pneumoperitoneum  was released.  The fascia at the umbilicus was closed with 0 Vicryl sutures.  The skin  incisions were irrigated with saline and then the skin was closed with  subcuticular sutures of 4-0 Monocryl and Steri-Strips.  Clean bandages were  placed and the patient taken recovery room in stable condition.  Estimated  blood loss was about 20 mL.  Complications:  None.  Sponge, needle and  instrument counts were correct.       HMI/MEDQ  D:  10/06/2004  T:  10/07/2004  Job:  [id number]   cc:   Jordan Hawks. Elnoria Howard, MD  Fax: [phone]

## 2010-07-30 NOTE — Consult Note (Signed)
NAME:  Lindsay Pineda, Lindsay Pineda NO.:  [id number]   MEDICAL RECORD NO.:  [id number]          PATIENT TYPE:  OBV   LOCATION:  1416                         FACILITY:  [facility]   PHYSICIAN:  Jordan Hawks. Elnoria Howard, MD    DATE OF BIRTH:  01-05-1981   DATE OF CONSULTATION:  10/06/2004  DATE OF DISCHARGE:                                   CONSULTATION   REASON FOR CONSULTATION:  Choledocholithiasis.   REFERRING PHYSICIAN:  Dr. Claud Kelp.   HISTORY OF PRESENT ILLNESS:  This is a 30 year old African-American female  who gave birth approximately 15 months ago. She presented to [facility] with a 1-day history of acute generalized upper abdominal pain with  the upper abdominal pain greater than the lower. This was associated with  vomiting but no reports of any fever. The patient subsequently was evaluated  and noted to have an elevated white blood cell count of 18,000, and mild  elevation of her ALT and sonographic evidence of a Murphy's sign. The  patient was noted to have gallstones without evidence of acute  cholecystitis. Because of her elevated white blood cell count and the  positive sonographic Murphy's sign, she underwent a laparoscopic  cholecystectomy and was noted to have an acute cholecystitis. During the  operation an intraoperative cholangiogram was performed and she was noted to  have multiple stones in the common bile duct. The operation was without  complications and the patient did well postoperatively. She will well known  to me as she was previously evaluated in the office for complaints of  bilateral lower abdominal pain. During the office visit it was felt that she  possibly had irritable bowel syndrome and had a prior abdominal ultrasound  which was negative for any evidence of cholecystitis or cholelithiasis.   PAST MEDICAL/SURGICAL HISTORY:  As stated above.   FAMILY HISTORY:  Noncontributory.   SOCIAL HISTORY:  The patient is married with a  98-month-old child. She  smokes 1/2 pack of cigarettes a day and drinks occasional alcoholic  beverages.   MEDICATIONS:  1.  Toradol 30 mg IV q.6h.  2.  Unasyn 3 grams IV q.6h.  3.  Morphine 1-2 mg IV q.3h p.r.n.  4.  Phenergan 25 mg q.3h p.r.n.  5.  No medications on the outpatient basis.   REVIEW OF SYSTEMS:  Negative for any complaints of headaches, dysuria,  dysarthria, myalgias, arthritis, arthralgias, shortness of breath, chest  pain. Positive for abdominal pain, nausea and vomiting. No evidence of any  fever.   PHYSICAL EXAMINATION:  VITAL SIGNS:  Blood pressure is 133/77, heart rate is  59, respirations 18, temperature is 98.6.  GENERAL:  The patient is in no acute distress. Alert and oriented.  HEENT:  Normocephalic, atraumatic. Extraocular movements are intact. Pupils  equal, round, reactive to light.  NECK:  Supple. No lymphadenopathy.  LUNGS:  Clear to auscultation bilaterally.  CARDIOVASCULAR:  Regular rate and rhythm without murmurs, rubs, or gallops.  ABDOMEN:  Obese, soft, tender to palpation at this time secondary to the  surgery. Positive bowel sounds.  EXTREMITIES:  No clubbing, cyanosis or edema.  LABORATORY VALUES:  White blood count is 18,500, hemoglobin 12.1, platelets  275,000. Sodium 132, potassium 3.9, chloride 101, CO2 23, BUN is 9,  creatinine 0.8, glucose 103. AST is 26, ALT 48, alkaline phosphatase 55,  total bilirubin is 0.1, lipase 31, albumin 3.9.   IMPRESSION:  1.  Status post acute cholecystitis.  2.  Choledocholithiasis.   After evaluation of the patient it is clear that she will require  intervention to remove the stones. After discussion with Dr. Derrell Lolling it does  not appear that the stones are obstructing and there was good flow of  contrast during the intraoperative cholangiogram into the small bowel.   PLAN:  Endoscopic retrograde cholangiopancreatography with sphincterotomy  and balloon extraction of the stones. I have discussed the  risks of the  procedure with both the patient and the husband, describing the risks of  bleeding, infection, perforation, medication reactions, a 7% risk for  pancreatitis and the risk of death all of which are not exclusive of any  other complications that may occur. The patient and her husband acknowledge  the risks and wish to proceed.       PDH/MEDQ  D:  10/06/2004  T:  10/07/2004  Job:  [id number]   cc:   Angelia Mould. Derrell Lolling, M.D.  [address], [address]  [place]  [place] [address]   Lenoard Aden, M.D.  [address]  [place]  [place] [address]  Fax: [phone]

## 2010-07-30 NOTE — Discharge Summary (Signed)
NAME:  Lindsay Pineda, Lindsay Pineda         ACCOUNT NO.:  [id number]   MEDICAL RECORD NO.:  [id number]          PATIENT TYPE:  INP   LOCATION:  5707                         FACILITY:  [facility]   PHYSICIAN:  Mobolaji B. Bakare, M.D.DATE OF BIRTH:  12-09-1980   DATE OF ADMISSION:  10/26/2004  DATE OF DISCHARGE:  10/29/2004                                 DISCHARGE SUMMARY   PRIMARY CARE PHYSICIAN:  Stacie Acres. Cliffton Asters, M.D.   GASTROENTEROLOGIST:  Anselmo Rod, M.D.   CONSULTATIONS:  1.  Surgical consultation, Dr. Purcell Nails.  2.  GI consult, Dr. Mann/Dr. Elnoria Howard.   FINAL DIAGNOSES:  1.  Right upper quadrant pain.  2.  Retained common bile duct stones, status post ERCP.  3.  Leukocytosis.  4.  Status post cholecystectomy.  5.  Obesity.  6.  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  7.  Tobacco abuse.  8.  Hypokalemia (corrected).   PROCEDURE:  CT scan of the abdomen and pelvis showed biliary stents without  any acute abnormality. Probable medullary sponge kidneys due to increased  density in the renal medulla bilaterally. The remainder of the kidneys were  unremarkable.   BRIEF HISTORY:  Lindsay Pineda is a 30 year old Caucasian female with  history of cholelithiasis. She had a cholecystectomy on October 06, 2004. She  had routine CBD stones and she has had two ERCPs. The second ERCP was done  on the 15th of August 2006. She had stents placed. This was noted to be a  rather prolonged procedure. After about 3-4 hours post procedure the patient  started experiencing right upper quadrant pain radiating to the back,  associated with nausea, but there was no vomiting. There was no fever or  chills. She was thus referred to the [facility] for further  evaluation and direct admission. Initial vital signs revealed a temperature  of 99.1, pulse 70, respiratory rate 20, blood pressure 126/79 with an oxygen  saturation of 98%. Further examination revealed a moderately obese patient  with no  respiratory distress, though she was not jaundiced or dehydrated.  Neck showed no mass or lymphadenopathy. Lungs were clear clinically to  auscultation. CVS revealed S1 and S2 regular. Abdomen was obese, soft. There  was marked tenderness on deep pressure in the right upper quadrant. This  tenderness was accentuated when the patient was asked to strain. There was  no rebound tenderness. No guarding. Bowel sounds were present. CNS was  unremarkable.   PERTINENT LABORATORY FINDINGS:  Lipase was 43. White cells 17.0, hemoglobin  11.8, platelet count 317,000. AST 20, ALT 21. Sodium 140, potassium 3.8,  chloride 108, CO2 28, BUN 7, creatinine 0.6.   HOSPITAL COURSE:  Lindsay Pineda was admitted for further evaluation to rule  out complication secondary to the procedure. However, she did not have  abdominal examination and CT of the abdomen did not reveal any perforation  or procedural complications. A surgical consult was obtained regarding the  retained common bile duct and stones, and post procedure pain it was found  by Dr. Johna Sheriff that the etiology was uncertain, however there is a  possibility that this pain  will resolve after the stent had been removed.  It was suggested symptomatic treatment for now and a repeat ERCP at a  tertiary hospital versus CBD exploration.   She was managed with narcotic analgesics for the pain and this seems to  control with good effect. She was discharged to continue on analgesics.   Leukocytosis resolved and by the time of discharge white cell was reduced to  11.7. She was empirically started on ciprofloxacin which she would continue  as an outpatient. She had no fever and there was no jaundice or elevated  liver enzymes to suggest cholangitis.   The patient was transferred regarding smoking cessation. She was discharged  home to follow up with Dr. Loreta Ave and to Dr. Laurann Montana.   DISCHARGE MEDICATIONS:  1.  Benadryl p.r.n.  2.  Protonix 40 mg p.o.  daily.  3.  Ciprofloxacin 500 mg p.o. b.i.d.  4.  Percocet p.r.n.   She was discharged in a stable condition.      Mobolaji B. Corky Downs, M.D.  Electronically Signed     MBB/MEDQ  D:  11/05/2004  T:  11/06/2004  Job:  [id number]   cc:   Anselmo Rod, M.D.  [address]  [address]  [place]  [place] [address]  Fax: [phone]   Jordan Hawks. Elnoria Howard, MD  Fax: [phone]   Stacie Acres. Cliffton Asters, M.D.  Fax: [phone]

## 2010-12-06 LAB — DIFFERENTIAL
Basophils Absolute: 0.1
Eosinophils Relative: 1
Lymphocytes Relative: 23
Lymphs Abs: 3.3
Monocytes Absolute: 0.8

## 2010-12-06 LAB — BASIC METABOLIC PANEL
Chloride: 105
GFR calc non Af Amer: >60
Glucose, Bld: 94
Potassium: 3.4 — ABNORMAL LOW
Sodium: 134 — ABNORMAL LOW

## 2010-12-06 LAB — CBC
HCT: 34.4 — ABNORMAL LOW
Hemoglobin: 11.7 — ABNORMAL LOW
RDW: 13.3

## 2010-12-06 LAB — POCT CARDIAC MARKERS: Troponin i, poc: <0.05

## 2010-12-24 LAB — DIFFERENTIAL
Basophils Absolute: 0.1
Basophils Relative: 0
Monocytes Absolute: 0.6
Neutro Abs: 8 — ABNORMAL HIGH
Neutrophils Relative %: 61

## 2010-12-24 LAB — POCT CARDIAC MARKERS
CKMB, poc: <1 — ABNORMAL LOW
Myoglobin, poc: 73.2

## 2010-12-24 LAB — COMPREHENSIVE METABOLIC PANEL
Alkaline Phosphatase: 54
BUN: 4 — ABNORMAL LOW
Chloride: 104
Glucose, Bld: 85
Potassium: 3.5
Total Bilirubin: 0.5

## 2010-12-24 LAB — D-DIMER, QUANTITATIVE: D-Dimer, Quant: 0.37

## 2010-12-24 LAB — CBC
HCT: 35.6 — ABNORMAL LOW
Hemoglobin: 11.9 — ABNORMAL LOW
WBC: 13.1 — ABNORMAL HIGH

## 2011-01-23 ENCOUNTER — Emergency Department (HOSPITAL_COMMUNITY)
Admission: EM | Admit: 2011-01-23 | Discharge: 2011-01-23 | Disposition: A | Discharge status: Home / Self Care | Payer: 59 | Attending: Emergency Medicine | Admitting: Emergency Medicine

## 2011-01-23 DIAGNOSIS — IMO0001 Reserved for inherently not codable concepts without codable children: Secondary | ICD-10-CM | POA: Insufficient documentation

## 2011-01-23 DIAGNOSIS — B370 Candidal stomatitis: Secondary | ICD-10-CM

## 2011-01-23 DIAGNOSIS — F17200 Nicotine dependence, unspecified, uncomplicated: Secondary | ICD-10-CM | POA: Insufficient documentation

## 2011-01-23 DIAGNOSIS — Z79899 Other long term (current) drug therapy: Secondary | ICD-10-CM | POA: Insufficient documentation

## 2011-01-23 DIAGNOSIS — J020 Streptococcal pharyngitis: Secondary | ICD-10-CM | POA: Insufficient documentation

## 2011-01-23 MED ORDER — PREDNISONE 20 MG PO TABS
60.0000 mg | ORAL_TABLET | Freq: Once | ORAL | Status: AC
  Administered 2011-01-23: 60 mg via ORAL
  Filled 2011-01-23: qty 1

## 2011-01-23 MED ORDER — HYDROCODONE-ACETAMINOPHEN 7.5-500 MG/15ML PO SOLN: 10.0000 mL | Freq: Four times a day (QID) | ORAL | Status: AC | PRN

## 2011-01-23 MED ORDER — NYSTATIN 100000 UNIT/ML MT SUSP: 500000.0000 [IU] | Freq: Four times a day (QID) | OROMUCOSAL | Status: AC

## 2011-01-23 MED ORDER — PENICILLIN G BENZATHINE 1200000 UNIT/2ML IM SUSP
1.2000 10*6.[IU] | Freq: Once | INTRAMUSCULAR | Status: AC
  Administered 2011-01-23: 1.2 10*6.[IU] via INTRAMUSCULAR
  Filled 2011-01-23: qty 2

## 2011-01-23 MED ORDER — PENICILLIN G BENZATHINE 600000 UNIT/ML IM SUSP: 1.2000 [IU] | Freq: Once | INTRAMUSCULAR | Status: DC

## 2011-01-23 NOTE — ED Provider Notes (Signed)
History     CSN: [id number] Arrival date & time: 01/23/2011  9:51 AM   First MD Initiated Contact with Patient 01/23/11 1513      Chief Complaint  Patient presents with  . Sore Throat    pt in with c/o sore throat x 3 days with bilateral earache no relief with home medications states pain in ears and throat 7/10    (Consider location/radiation/quality/duration/timing/severity/associated sxs/prior treatment) Patient is a 30 y.o. female presenting with pharyngitis. The history is provided by the patient.  Sore Throat This is a new problem. The current episode started in the past 7 days. The problem occurs constantly. The problem has been gradually worsening. Associated symptoms include chills, myalgias, neck pain, a sore throat and swollen glands. Pertinent negatives include no abdominal pain, coughing, fever, nausea or vomiting. The symptoms are aggravated by swallowing. She has tried nothing for the symptoms.  Pt with sore throat for last two days, malaise. Denies measuring temp. Also reports thrush on her tongue for about 4 days. States recently finished antibiotic (7d ago) for vaginal infection. Does not know the name of the antibiotic  History reviewed. No pertinent past medical history.  Past Surgical History  Procedure Date  . Cholecystectomy   . Tubal ligation   . Dental surgery     No family history on file.  History  Substance Use Topics  . Smoking status: Current Everyday Smoker  . Smokeless tobacco: Not on file  . Alcohol Use: Yes    OB History    Grav Para Term Preterm Abortions TAB SAB Ect Mult Living                  Review of Systems  Constitutional: Positive for chills. Negative for fever.  HENT: Positive for sore throat and neck pain.        Thrush  Eyes: Negative.   Respiratory: Negative for cough and chest tightness.   Cardiovascular: Negative.   Gastrointestinal: Negative for nausea, vomiting and abdominal pain.  Musculoskeletal: Positive for  myalgias.  Skin: Negative.   Neurological: Negative.   Psychiatric/Behavioral: Negative.     Allergies  Percocet  Home Medications   Current Outpatient Rx  Name Route Sig Dispense Refill  . FUROSEMIDE 20 MG PO TABS Oral Take 20 mg by mouth daily as needed. For swelling     . VITAMIN D (ERGOCALCIFEROL) 50000 UNITS PO CAPS Oral Take 50,000 Units by mouth. Sundays and Mondays     . HYDROCODONE-ACETAMINOPHEN 7.5-500 MG/15ML PO SOLN Oral Take 10 mLs by mouth every 6 (six) hours as needed for pain. 120 mL 0  . NYSTATIN 100000 UNIT/ML MT SUSP Oral Take 5 mLs (500,000 Units total) by mouth 4 (four) times daily. 60 mL 0    BP 140/98  Pulse 94  Temp(Src) 99.1 F (37.3 C) (Oral)  Resp 18  SpO2 99%  LMP 01/05/2011  Physical Exam  Nursing note and vitals reviewed. Constitutional: She is oriented to person, place, and time. She appears well-developed and well-nourished. No distress.  HENT:  Head: Normocephalic and atraumatic.  Right Ear: Tympanic membrane, external ear and ear canal normal.  Left Ear: Tympanic membrane, external ear and ear canal normal.  Nose: Nose normal.  Mouth/Throat: Uvula is midline and mucous membranes are normal. Oropharyngeal exudate present.       Tonsils enlarged, erythemous. White exudate on the tongue  Eyes: Conjunctivae are normal. Pupils are equal, round, and reactive to light.  Neck: Normal range of motion.  Neck supple.  Cardiovascular: Normal rate, regular rhythm and normal heart sounds.   Pulmonary/Chest: Effort normal and breath sounds normal. No respiratory distress.  Abdominal: Soft. Bowel sounds are normal.  Musculoskeletal: Normal range of motion.  Lymphadenopathy:    She has cervical adenopathy.  Neurological: She is alert and oriented to person, place, and time.  Skin: Skin is warm and dry.    ED Course  Procedures (including critical care time) Strep screen positive. Treated with penicillin 1.62million units IM. 60mg  prednisone given po  for edema. Will treat thrush with nystatic, pain meds for home. Pt non toxic. No signs of a peritonsilar abscess.   MDM          Lottie Mussel, PA 01/23/11 1528

## 2011-01-24 NOTE — ED Provider Notes (Signed)
Medical screening examination/treatment/procedure(s) were performed by non-physician practitioner and as supervising physician I was immediately available for consultation/collaboration.   Harli Engelken, MD 01/24/11 0036 

## 2011-02-04 ENCOUNTER — Telehealth: Payer: Self-pay | Admitting: Oncology

## 2011-02-04 NOTE — Telephone Encounter (Signed)
Pt called back to confirm her appts and the d/t that was given the pt could not do due to her work schedule. The pt wanted an afternoon appt. R/s the pt's appt to an afternoon and she is aware.

## 2011-02-04 NOTE — Telephone Encounter (Signed)
lmonvm of the pt's cell phone regarding her new pt appt on 02/14/2011 and for her to call me back to confirm the appt

## 2011-02-11 ENCOUNTER — Other Ambulatory Visit: Payer: Self-pay | Admitting: Obstetrics and Gynecology

## 2011-02-14 ENCOUNTER — Ambulatory Visit: Payer: 59

## 2011-02-14 ENCOUNTER — Ambulatory Visit: Payer: 59 | Admitting: Oncology

## 2011-02-14 ENCOUNTER — Encounter (HOSPITAL_COMMUNITY): Payer: Self-pay | Admitting: *Deleted

## 2011-02-14 ENCOUNTER — Other Ambulatory Visit: Payer: 59 | Admitting: Lab

## 2011-02-18 ENCOUNTER — Encounter (HOSPITAL_COMMUNITY): Payer: Self-pay | Admitting: Pharmacist

## 2011-02-18 ENCOUNTER — Encounter: Payer: Self-pay | Admitting: Oncology

## 2011-02-18 DIAGNOSIS — D649 Anemia, unspecified: Secondary | ICD-10-CM | POA: Insufficient documentation

## 2011-02-20 NOTE — H&P (Signed)
NAME:  Lindsay Pineda, Lindsay Pineda NO.:  [id number]  MEDICAL RECORD NO.:  [id number]  LOCATION:  PERIO                         FACILITY:  [facility]  PHYSICIAN:  Janine Limbo, M.D.DATE OF BIRTH:  Nov 25, 1980  DATE OF ADMISSION:  02/10/2011 DATE OF DISCHARGE:                             HISTORY & PHYSICAL   HISTORY OF PRESENT ILLNESS:  Lindsay Pineda is a 30 year old female, para 3-0-0-3, who presents for a cold knife conization of the cervix. The patient has been followed at the [facility].  The patient had colposcopically directed biopsies, which showed cervical intraepithelial neoplasia II.  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 The patient has had a tubal ligation.  DRUG ALLERGIES:  The patient reports that PERCOCET causes hallucinations.  OBSTETRICAL HISTORY:  The patient has had 3 term vaginal deliveries.  PAST MEDICAL HISTORY:  The patient is obese, and her weight has been greater than 300 pounds.  She has had a tubal ligation as mentioned above.  The patient had borderline hypertension during her pregnancy. The patient had a cholecystectomy in 2007.  She had her wisdom teeth removed in 2008.  SOCIAL HISTORY:  The patient has been a cigarette smoker in the past. She denies recreational drug uses.  REVIEW OF SYSTEMS:  The patient has a history of headaches.  She also has gastroesophageal reflux, and irritable bowel syndrome.  FAMILY HISTORY:  Noncontributory.  PHYSICAL EXAMINATION:  VITAL SIGNS:  Height is 5 feet 4 inches, weight is 277 pounds. HEENT:  Within normal limits. CHEST:  Clear. HEART:  Regular rate and rhythm. BREASTS:  Without masses. ABDOMEN:  Nontender. EXTREMITIES:  Grossly normal. NEUROLOGIC:  Grossly normal. PELVIC:  External genitalia is normal.  Vagina is normal.  Cervix is nontender.  Uterus is normal size, shape, and consistency.  Adnexa,  no masses.  ASSESSMENT: 1. Cervical intraepithelial neoplasia II. 2. Morbid obesity.  PLAN:  The patient will undergo a cold knife conization of the cervix. She understands indications for her surgical procedure, and she accepts the risks of, but not limited to, anesthetic complications, bleeding, infections, and possible damage to the surrounding organs.  Date for this surgical procedure is February 25, 2011.     Janine Limbo, M.D.     AVS/MEDQ  D:  02/20/2011  T:  02/20/2011  Job:  [id number]

## 2011-02-23 ENCOUNTER — Encounter: Payer: Self-pay | Admitting: Oncology

## 2011-02-23 DIAGNOSIS — D72829 Elevated white blood cell count, unspecified: Secondary | ICD-10-CM | POA: Insufficient documentation

## 2011-02-23 DIAGNOSIS — E559 Vitamin D deficiency, unspecified: Secondary | ICD-10-CM | POA: Insufficient documentation

## 2011-02-24 ENCOUNTER — Ambulatory Visit: Payer: 59

## 2011-02-24 ENCOUNTER — Encounter: Payer: Self-pay | Admitting: Oncology

## 2011-02-24 ENCOUNTER — Other Ambulatory Visit (HOSPITAL_BASED_OUTPATIENT_CLINIC_OR_DEPARTMENT_OTHER): Payer: 59 | Admitting: Lab

## 2011-02-24 ENCOUNTER — Ambulatory Visit (HOSPITAL_BASED_OUTPATIENT_CLINIC_OR_DEPARTMENT_OTHER): Payer: 59 | Admitting: Oncology

## 2011-02-24 VITALS — BP 165/99 | HR 80 | Temp 97.7°F | Ht 64.5 in | Wt 281.9 lb

## 2011-02-24 DIAGNOSIS — D72829 Elevated white blood cell count, unspecified: Secondary | ICD-10-CM

## 2011-02-24 DIAGNOSIS — D649 Anemia, unspecified: Secondary | ICD-10-CM

## 2011-02-24 DIAGNOSIS — B373 Candidiasis of vulva and vagina: Secondary | ICD-10-CM | POA: Insufficient documentation

## 2011-02-24 LAB — COMPREHENSIVE METABOLIC PANEL
AST: 14 U/L (ref 0–37)
Albumin: 3.4 g/dL — ABNORMAL LOW (ref 3.5–5.2)
Alkaline Phosphatase: 82 U/L (ref 39–117)
Potassium: 3.6 mEq/L (ref 3.5–5.3)
Sodium: 138 mEq/L (ref 135–145)
Total Protein: 7.7 g/dL (ref 6.0–8.3)

## 2011-02-24 LAB — CBC & DIFF AND RETIC
Eosinophils Absolute: 0.1 10*3/uL (ref 0.0–0.5)
Immature Retic Fract: 7.3 % (ref 1.60–10.00)
LYMPH%: 24.8 % (ref 14.0–49.7)
MONO#: 0.7 10*3/uL (ref 0.1–0.9)
NEUT#: 10.6 10*3/uL — ABNORMAL HIGH (ref 1.5–6.5)
Platelets: 273 10*3/uL (ref 145–400)
RBC: 4.57 10*6/uL (ref 3.70–5.45)
Retic %: 1.57 % (ref 0.70–2.10)
Retic Ct Abs: 71.75 10*3/uL (ref 33.70–90.70)
WBC: 15.1 10*3/uL — ABNORMAL HIGH (ref 3.9–10.3)
nRBC: 0 % (ref 0–0)

## 2011-02-24 NOTE — Progress Notes (Signed)
[facility] INITIAL HEMATOLOGY CONSULTATION  Reason for Referral: leukocytosis  HPI: Ms. Cino is a 30 yo woman with recurrent UTI and vaginal yeast infection.  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  She does not know whether her leukocytosis predated 2008.  Her leukocytosis was as high as 20K.  She cannot recall whether she had worse leukocytosis with active infection.  Her last CBC ordered by her PCP on 01/14/11 showed WBC 15.9; Hgb 12.5; plt adequate.  Given this leukocytosis, she was kindly referred to the [facility] for evaluation.  She is here for the first time with her husband and two children.  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  She denies fever, headache currently, skin rash, cough, diarrhea, dysuria, vaginal discharge.\  Patient denies visual changes, confusion, drenching night sweats, palpable lymph node swelling, mucositis, odynophagia, dysphagia, nausea vomiting, jaundice, chest pain, palpitation, shortness of breath, dyspnea on exertion, productive cough, gum bleeding, epistaxis, hematemesis, hemoptysis, abdominal pain, abdominal swelling, early satiety, melena, hematochezia, hematuria, skin rash, spontaneous bleeding, joint swelling, joint pain, heat or cold intolerance, bowel bladder incontinence, back pain, focal motor weakness, paresthesia, depression, suicidal or homocidal ideation, feeling hopelessness.     Past Medical History  Diagnosis Date  . UTI (lower urinary tract infection)     currently being tx - finished abx 02/15/11   . Headache     imitrex - 02/11/11  . Arthritis     back - no med  . Anxiety     no meds  . GERD (gastroesophageal reflux disease)     diet controlled - no med  . Bacterial infection     currently - finishing meds 02/15/11  . Anemia   . Vitamin D deficiency   . Leukocytosis   . Vaginal yeast infection    recurrent at least 5x/year.   :    Past Surgical History  Procedure Date  . Cholecystectomy   . Tubal ligation   . Dental surgery     wisdom teeth ext  . Svd     x 3  . Colonscopy   :   CURRENT MEDS: Current Outpatient Prescriptions  Medication Sig Dispense Refill  . furosemide (LASIX) 20 MG tablet Take 20 mg by mouth daily as needed. For swelling       . SUMAtriptan (IMITREX) 100 MG tablet Take 50 mg by mouth every 2 (two) hours as needed.       . Vitamin D, Ergocalciferol, (DRISDOL) 50000 UNITS CAPS Take 50,000 Units by mouth. Sundays and Mondays           Allergies  Allergen Reactions  . Percocet (Oxycodone-Acetaminophen) Hives    Hallucinations  :  Family History  Problem Relation Age of Onset  . Hypothyroidism Mother   . Thyroid disease Mother   . Hypertension Father   . Diabetes Father   . Cancer Maternal Grandmother     colon cancer  :  History   Social History  . Marital Status: Married    Spouse Name: N/A    Number of Children: 2  . Years of Education: N/A   Occupational History  . data entry [employer]   Social History Main Topics  . Smoking status: Current Everyday Smoker -- 0.2 packs/day for 15 years    Types: Cigarettes  . Smokeless tobacco: Never Used  . Alcohol Use: Yes  socially  . Drug Use: No  . Sexually Active: Yes    Birth Control/ Protection: Surgical   Other Topics Concern  . Not on file   Social History Narrative  . No narrative on file    REVIEW OF SYSTEM:  The rest of the 14-point review of sytem was negative.   Exam:  General:  well-nourished in no acute distress.  Eyes:  no scleral icterus.  ENT:  There were no oropharyngeal lesions.  Neck was without thyromegaly.  Lymphatics:  Negative cervical, supraclavicular or axillary adenopathy.  Respiratory: lungs were clear bilaterally without wheezing or crackles.  Cardiovascular:  Regular rate and rhythm, S1/S2, without murmur, rub or gallop.  There was no pedal edema.   GI:  abdomen was soft, flat, nontender, nondistended, without organomegaly.  Muscoloskeletal:  no spinal tenderness of palpation of vertebral spine.  Skin exam was without echymosis, petichae.  Neuro exam was nonfocal.  Patient was able to get on and off exam table without assistance.  Gait was normal.  Patient was alerted and oriented.  Attention was good.   Language was appropriate.  Mood was normal without depression.  Speech was not pressured.  Thought content was not tangential.    LABS:   Basename 02/24/11 1515  WBC 15.1*  HGB 12.1  HCT 38.0  PLT 273    Basename 02/24/11 1515  NA 138  K 3.6  CL 106  CO2 24  GLUCOSE 75  BUN 10  CREATININE 0.78  CALCIUM 9.2     Blood smear review:   I personally reviewed the patient's peripheral blood smear today.  There was isocytosis.  There was increase in neutrophil.  There was no peripheral blast.  There was no schistocytosis, spherocytosis, target cell, rouleaux formation, tear drop cell.  There was no giant platelets or platelet clumps.      ASSESSMENT AND PLAN:   1.  Leukocytosis:  It has been going on since 2008.  This is most likely reactive to her history of recurrent UTI/vaginal yeast infection.   There is very low pretest probability of acute leukemia given the chronicity of the problem.  My review of her peripheral blood smear did not show immature blast.  There was no atypical leukocytes on blood smear and lack of adenopathy and B symptoms making it less likely to be a lymphoproliferative process.  I however recommended BCR/ABL testing to rule out CML since it has been on going for quite a while.  She agreed and thus I sent peripheral blood for BCR/ABL by PCR.  I discussed with her that at this time, given that her leukocytosis has been quite stable, a diagnostic bone marrow biopsy would have low clinical utility.  However, in the future, if her leukocytosis significantly worsens to >20K or she develops anemia, thrombocytopenia,  then I may consider diagnostic bone marrow biopsy at that time.    2.  HTN:  She is not only any med.  I strongly advised her to follow up with her PCP to start some antihypertensive med to avoid antihypertensive crisis or long term sequale from [facility].    3.  Follow up:  Lab only with CBC in 3 and 6 months.  I'll see her in 9 months.  She preferred to have lab drawn at [employer].

## 2011-02-25 ENCOUNTER — Encounter (HOSPITAL_COMMUNITY): Payer: Self-pay | Admitting: *Deleted

## 2011-02-25 ENCOUNTER — Ambulatory Visit (HOSPITAL_COMMUNITY): Payer: 59 | Admitting: Anesthesiology

## 2011-02-25 ENCOUNTER — Ambulatory Visit (HOSPITAL_COMMUNITY)
Admission: RE | Admit: 2011-02-25 | Discharge: 2011-02-25 | Disposition: A | Discharge status: Home / Self Care | Payer: 59 | Source: Ambulatory Visit | Attending: Obstetrics and Gynecology | Admitting: Obstetrics and Gynecology

## 2011-02-25 ENCOUNTER — Encounter (HOSPITAL_COMMUNITY)
Admission: RE | Disposition: A | Discharge status: Home / Self Care | Payer: Self-pay | Source: Ambulatory Visit | Attending: Obstetrics and Gynecology

## 2011-02-25 ENCOUNTER — Encounter (HOSPITAL_COMMUNITY): Payer: Self-pay | Admitting: Anesthesiology

## 2011-02-25 ENCOUNTER — Other Ambulatory Visit: Payer: Self-pay | Admitting: Oncology

## 2011-02-25 ENCOUNTER — Other Ambulatory Visit: Payer: Self-pay | Admitting: Obstetrics and Gynecology

## 2011-02-25 DIAGNOSIS — D72829 Elevated white blood cell count, unspecified: Secondary | ICD-10-CM

## 2011-02-25 DIAGNOSIS — N871 Moderate cervical dysplasia: Secondary | ICD-10-CM | POA: Insufficient documentation

## 2011-02-25 HISTORY — DX: Urinary tract infection, site not specified: N39.0

## 2011-02-25 HISTORY — DX: Gastro-esophageal reflux disease without esophagitis: K21.9

## 2011-02-25 HISTORY — PX: CERVICAL CONIZATION W/BX: SHX1330

## 2011-02-25 HISTORY — DX: Bacterial infection, unspecified: A49.9

## 2011-02-25 HISTORY — DX: Anxiety disorder, unspecified: F41.9

## 2011-02-25 HISTORY — DX: Headache: R51

## 2011-02-25 LAB — CBC
HCT: 37.5 % (ref 36.0–46.0)
Hemoglobin: 11.9 g/dL — ABNORMAL LOW (ref 12.0–15.0)
MCH: 27.2 pg (ref 26.0–34.0)
MCV: 85.8 fL (ref 78.0–100.0)
Platelets: 257 10*3/uL (ref 150–400)
RBC: 4.37 MIL/uL (ref 3.87–5.11)
WBC: 13.4 10*3/uL — ABNORMAL HIGH (ref 4.0–10.5)

## 2011-02-25 LAB — IRON AND TIBC: TIBC: 375 ug/dL (ref 250–470)

## 2011-02-25 LAB — HAPTOGLOBIN: Haptoglobin: 307 mg/dL — ABNORMAL HIGH (ref 30–200)

## 2011-02-25 SURGERY — CONE BIOPSY, CERVIX
Anesthesia: General | Site: Vagina | Wound class: Clean Contaminated

## 2011-02-25 MED ORDER — ONDANSETRON HCL 4 MG/2ML IJ SOLN
INTRAMUSCULAR | Status: AC
  Filled 2011-02-25: qty 2

## 2011-02-25 MED ORDER — FENTANYL CITRATE 0.05 MG/ML IJ SOLN
INTRAMUSCULAR | Status: DC | PRN
  Administered 2011-02-25: 100 ug via INTRAVENOUS
  Administered 2011-02-25: 50 ug via INTRAVENOUS

## 2011-02-25 MED ORDER — DEXAMETHASONE SODIUM PHOSPHATE 4 MG/ML IJ SOLN
INTRAMUSCULAR | Status: DC | PRN
  Administered 2011-02-25: 10 mg via INTRAVENOUS

## 2011-02-25 MED ORDER — DEXAMETHASONE SODIUM PHOSPHATE 10 MG/ML IJ SOLN
INTRAMUSCULAR | Status: AC
  Filled 2011-02-25: qty 1

## 2011-02-25 MED ORDER — KETOROLAC TROMETHAMINE 30 MG/ML IJ SOLN
INTRAMUSCULAR | Status: DC | PRN
  Administered 2011-02-25: 30 mg via INTRAVENOUS

## 2011-02-25 MED ORDER — MEPERIDINE HCL 50 MG PO TABS: 50.0000 mg | ORAL_TABLET | ORAL | Status: AC | PRN

## 2011-02-25 MED ORDER — LUGOLS 5 % PO SOLN
ORAL | Status: DC | PRN
  Administered 2011-02-25: 0.1 mL via ORAL

## 2011-02-25 MED ORDER — BUPIVACAINE-EPINEPHRINE 0.5% -1:200000 IJ SOLN
INTRAMUSCULAR | Status: DC | PRN
  Administered 2011-02-25: 20 mL

## 2011-02-25 MED ORDER — PANTOPRAZOLE SODIUM 40 MG PO TBEC: 40.0000 mg | DELAYED_RELEASE_TABLET | Freq: Once | ORAL | Status: DC

## 2011-02-25 MED ORDER — KETOROLAC TROMETHAMINE 60 MG/2ML IM SOLN
INTRAMUSCULAR | Status: DC | PRN
  Administered 2011-02-25: 30 mg via INTRAMUSCULAR

## 2011-02-25 MED ORDER — IBUPROFEN 200 MG PO TABS: 800.0000 mg | ORAL_TABLET | Freq: Three times a day (TID) | ORAL | Status: AC | PRN

## 2011-02-25 MED ORDER — LACTATED RINGERS IV SOLN
INTRAVENOUS | Status: DC
  Administered 2011-02-25 (×2): via INTRAVENOUS

## 2011-02-25 MED ORDER — ACETIC ACID 4% SOLUTION
Status: DC | PRN
  Administered 2011-02-25: 1 via TOPICAL

## 2011-02-25 MED ORDER — FENTANYL CITRATE 0.05 MG/ML IJ SOLN: 25.0000 ug | INTRAMUSCULAR | Status: DC | PRN

## 2011-02-25 MED ORDER — MIDAZOLAM HCL 2 MG/2ML IJ SOLN
INTRAMUSCULAR | Status: AC
  Filled 2011-02-25: qty 2

## 2011-02-25 MED ORDER — ONDANSETRON HCL 4 MG/2ML IJ SOLN
INTRAMUSCULAR | Status: DC | PRN
  Administered 2011-02-25: 4 mg via INTRAVENOUS

## 2011-02-25 MED ORDER — KETOROLAC TROMETHAMINE 30 MG/ML IJ SOLN
INTRAMUSCULAR | Status: AC
  Filled 2011-02-25: qty 2

## 2011-02-25 MED ORDER — PROPOFOL 10 MG/ML IV EMUL
INTRAVENOUS | Status: AC
  Filled 2011-02-25: qty 20

## 2011-02-25 MED ORDER — LIDOCAINE HCL (CARDIAC) 20 MG/ML IV SOLN
INTRAVENOUS | Status: DC | PRN
  Administered 2011-02-25: 50 mg via INTRAVENOUS

## 2011-02-25 MED ORDER — GLYCOPYRROLATE 0.2 MG/ML IJ SOLN
INTRAMUSCULAR | Status: DC | PRN
  Administered 2011-02-25: 0.2 mg via INTRAVENOUS

## 2011-02-25 MED ORDER — FENTANYL CITRATE 0.05 MG/ML IJ SOLN
INTRAMUSCULAR | Status: AC
  Filled 2011-02-25: qty 5

## 2011-02-25 MED ORDER — MIDAZOLAM HCL 5 MG/5ML IJ SOLN
INTRAMUSCULAR | Status: DC | PRN
  Administered 2011-02-25: 1 mg via INTRAVENOUS

## 2011-02-25 MED ORDER — PROPOFOL 10 MG/ML IV EMUL
INTRAVENOUS | Status: DC | PRN
  Administered 2011-02-25: 250 mg via INTRAVENOUS

## 2011-02-25 MED ORDER — GLYCOPYRROLATE 0.2 MG/ML IJ SOLN
INTRAMUSCULAR | Status: AC
  Filled 2011-02-25: qty 1

## 2011-02-25 MED ORDER — LIDOCAINE HCL (CARDIAC) 20 MG/ML IV SOLN
INTRAVENOUS | Status: AC
  Filled 2011-02-25: qty 5

## 2011-02-25 SURGICAL SUPPLY — 27 items
APPLICATOR COTTON TIP 6IN STRL (MISCELLANEOUS) IMPLANT
BLADE SURG 11 STRL SS (BLADE) ×2 IMPLANT
CLOTH BEACON ORANGE TIMEOUT ST (SAFETY) ×2 IMPLANT
CONTAINER PREFILL 10% NBF 60ML (FORM) ×2 IMPLANT
COUNTER NEEDLE 1200 MAGNETIC (NEEDLE) IMPLANT
ELECT REM PT RETURN 9FT ADLT (ELECTROSURGICAL)
ELECTRODE REM PT RTRN 9FT ADLT (ELECTROSURGICAL) IMPLANT
GAUZE SPONGE 4X4 16PLY XRAY LF (GAUZE/BANDAGES/DRESSINGS) IMPLANT
GLOVE BIOGEL PI IND STRL 8.5 (GLOVE) ×1 IMPLANT
GLOVE BIOGEL PI INDICATOR 8.5 (GLOVE) ×1
GLOVE ECLIPSE 8.0 STRL XLNG CF (GLOVE) ×4 IMPLANT
GOWN PREVENTION PLUS LG XLONG (DISPOSABLE) ×2 IMPLANT
NDL SPNL 22GX3.5 QUINCKE BK (NEEDLE) ×1 IMPLANT
NEEDLE SPNL 22GX3.5 QUINCKE BK (NEEDLE) ×2 IMPLANT
NS IRRIG 1000ML POUR BTL (IV SOLUTION) ×2 IMPLANT
PACK VAGINAL MINOR WOMEN LF (CUSTOM PROCEDURE TRAY) ×2 IMPLANT
PENCIL BUTTON HOLSTER BLD 10FT (ELECTRODE) IMPLANT
SCOPETTES 8  STERILE (MISCELLANEOUS) ×2
SCOPETTES 8 STERILE (MISCELLANEOUS) ×2 IMPLANT
SPONGE SURGIFOAM ABS GEL 12-7 (HEMOSTASIS) IMPLANT
SUT VICRYL 0 UR6 27IN ABS (SUTURE) ×4 IMPLANT
SYR CONTROL 10ML LL (SYRINGE) ×2 IMPLANT
SYR TB 1ML 25GX5/8 (SYRINGE) IMPLANT
TOWEL OR 17X24 6PK STRL BLUE (TOWEL DISPOSABLE) ×4 IMPLANT
TUBING NON-CON 1/4 X 20 CONN (TUBING) IMPLANT
WATER STERILE IRR 1000ML POUR (IV SOLUTION) ×2 IMPLANT
YANKAUER SUCT BULB TIP NO VENT (SUCTIONS) IMPLANT

## 2011-02-25 NOTE — Transfer of Care (Deleted)
Immediate Anesthesia Transfer of Care Note  Patient: Lindsay Pineda  Procedure(s) Performed:  CONIZATION CERVIX WITH BIOPSY  Patient Location: PACU  Anesthesia Type: General  Level of Consciousness: awake and alert   Airway & Oxygen Therapy: Patient Spontanous Breathing and Patient connected to nasal cannula oxygen  Post-op Assessment: Report given to PACU RN and Post -op Vital signs reviewed and stable  Post vital signs: Reviewed and stable  Complications: No apparent anesthesia complications

## 2011-02-25 NOTE — Progress Notes (Signed)
Addended by: Jethro Bolus T on: 02/25/2011 08:36 AM   Modules accepted: Orders

## 2011-02-25 NOTE — Anesthesia Preprocedure Evaluation (Signed)
Anesthesia Evaluation  Patient identified by MRN, date of birth, ID band Patient awake    Reviewed: Allergy & Precautions, H&P , Patient's Chart, lab work & pertinent test results, reviewed documented beta blocker date and time   Airway Mallampati: III TM Distance: >3 FB Neck ROM: full    Dental No notable dental hx.    Pulmonary  clear to auscultation  Pulmonary exam normal       Cardiovascular regular Normal    Neuro/Psych    GI/Hepatic Controlled,  Endo/Other  Morbid obesity  Renal/GU      Musculoskeletal   Abdominal   Peds  Hematology   Anesthesia Other Findings   Reproductive/Obstetrics                           Anesthesia Physical Anesthesia Plan  ASA: III  Anesthesia Plan: General   Post-op Pain Management:    Induction: Intravenous  Airway Management Planned: LMA  Additional Equipment:   Intra-op Plan:   Post-operative Plan:   Informed Consent: I have reviewed the patients History and Physical, chart, labs and discussed the procedure including the risks, benefits and alternatives for the proposed anesthesia with the patient or authorized representative who has indicated his/her understanding and acceptance.   Dental Advisory Given  Plan Discussed with: CRNA and Surgeon  Anesthesia Plan Comments: (  Discussed  general anesthesia, including possible nausea, instrumentation of airway, sore throat,pulmonary aspiration, etc. I asked if the were any outstanding questions, or  concerns before we proceeded. )        Anesthesia Quick Evaluation

## 2011-02-25 NOTE — Transfer of Care (Signed)
Immediate Anesthesia Transfer of Care Note  Patient: Lindsay Pineda  Procedure(s) Performed:  CONIZATION CERVIX WITH BIOPSY  Patient Location: PACU  Anesthesia Type: General  Level of Consciousness: awake and alert   Airway & Oxygen Therapy: Patient Spontanous Breathing and Patient connected to nasal cannula oxygen  Post-op Assessment: Report given to PACU RN and Post -op Vital signs reviewed and stable  Post vital signs: Reviewed and stable  Complications: No apparent anesthesia complications 

## 2011-02-25 NOTE — Anesthesia Postprocedure Evaluation (Signed)
  Anesthesia Post-op Note  Patient: Lindsay Pineda  Procedure(s) Performed:  CONIZATION CERVIX WITH BIOPSY  Patient Location: PACU  Anesthesia Type: General  Level of Consciousness: awake, alert  and oriented  Airway and Oxygen Therapy: Patient Spontanous Breathing  Post-op Pain: none  Post-op Assessment: Post-op Vital signs reviewed, Patient's Cardiovascular Status Stable, Respiratory Function Stable, Patent Airway, No signs of Nausea or vomiting and Pain level controlled  Post-op Vital Signs: Reviewed and stable  Complications: No apparent anesthesia complications

## 2011-02-25 NOTE — H&P (Signed)
The patient was interviewed and examined today.  The previously documented history and physical examination was reviewed. There are no changes. The operative procedure was reviewed. The risks and benefits were outlined again. The specific risks include, but are not limited to, anesthetic complications, bleeding, infections, and possible damage to the surrounding organs. The patient's questions were answered.  We are ready to proceed as outlined. The likelihood of the patient achieving the goals of this procedure is very likely.   Pavle Wiler [place], M.D.  

## 2011-02-25 NOTE — Op Note (Addendum)
OPERATIVE NOTE  Lindsay Pineda  DOB:    Aug 14, 1980  MRN:    [id number]  CSN:    [id number]  Date of Surgery:  02/25/2011  Preoperative Diagnosis:  Cervical intraepithelial neoplasia II  Obesity  Postoperative Diagnosis:  Same  Procedure:  Cold knife conization of the cervix  Surgeon:  Leonard Schwartz, M.D.  Assistant:  None  Anesthetic:  General  Disposition:  The patient is a 30 year old female who presents with the above-mentioned diagnosis. She understands the indications for surgical procedure and she accepts the risk of, but not limited to, anesthetic complications, bleeding, infections, and possible damage to the surrounding organs.  Findings:  The uterus is upper limits normal size. No adnexal masses are appreciated. The bowel solution was applied to the cervix and there was a small nonstaining area at the 6:00 position.  Procedure:  The patient was taken to the operating room where a general anesthetic was given. The patient's lower abdomen, perineum, and vagina were prepped with multiple layers of Betadine. The bladder was drained of urine. The patient was sterilely draped. Examination under anesthesia was performed. A speculum was placed in the vagina. A paracervical block was placed using half percent Marcaine with epinephrine. 10 cc of half percent Marcaine with epinephrine were injected directly into the cervix. Angle sutures were placed at the 3:00 and 9:00 positions on the cervix. The uterus was sounded. The Lugol solution was applied to the cervix. There was a small nonstaining area at the 6:00 position. A circumferential incision was made around the cervical os removing all nonstaining areas. The incision was then extended in a cone and light fashion to the endocervix. A stitch was placed at 12:00 on the conization specimen. The specimen was sent to pathology. The periphery of the incision was cauterized using the Bovie cautery. Inverting  sutures were placed at the 12:00, 3:00, 6:00, and 9:00 positions. Hemostasis was achieved using the bipolar cautery. The patient's exam was repeated and the uterus was noted to be firm. All instruments were removed. Sponge, needle, and instrument counts were correct. The patient was awakened from her anesthetic without difficulty. She was transported to the recovery room in stable condition. The estimated blood loss was 20 cc. The patient tolerated the procedure well.  Leonard Schwartz, M.D.

## 2011-02-28 ENCOUNTER — Encounter (HOSPITAL_COMMUNITY): Payer: Self-pay | Admitting: Obstetrics and Gynecology

## 2011-03-01 LAB — BCR/ABL (LIO MMD)

## 2011-03-15 ENCOUNTER — Inpatient Hospital Stay (HOSPITAL_COMMUNITY)
Admission: AD | Admit: 2011-03-15 | Discharge: 2011-03-15 | Disposition: A | Discharge status: Home / Self Care | Payer: 59 | Source: Ambulatory Visit | Attending: Obstetrics and Gynecology | Admitting: Obstetrics and Gynecology

## 2011-03-15 ENCOUNTER — Encounter (HOSPITAL_COMMUNITY): Payer: Self-pay | Admitting: *Deleted

## 2011-03-15 DIAGNOSIS — R1032 Left lower quadrant pain: Secondary | ICD-10-CM | POA: Insufficient documentation

## 2011-03-15 DIAGNOSIS — N390 Urinary tract infection, site not specified: Secondary | ICD-10-CM | POA: Insufficient documentation

## 2011-03-15 HISTORY — DX: Essential (primary) hypertension: I10

## 2011-03-15 HISTORY — DX: Calculus of kidney: N20.0

## 2011-03-15 LAB — BASIC METABOLIC PANEL
BUN: 7 mg/dL (ref 6–23)
Chloride: 103 mEq/L (ref 96–112)
Creatinine, Ser: 0.63 mg/dL (ref 0.50–1.10)
GFR calc Af Amer: >90 mL/min (ref >90)
GFR calc non Af Amer: >90 mL/min (ref >90)

## 2011-03-15 LAB — CBC
HCT: 37 % (ref 36.0–46.0)
Hemoglobin: 11.9 g/dL — ABNORMAL LOW (ref 12.0–15.0)
MCH: 27.4 pg (ref 26.0–34.0)
MCHC: 32.2 g/dL (ref 30.0–36.0)
RDW: 14.7 % (ref 11.5–15.5)

## 2011-03-15 LAB — DIFFERENTIAL
Basophils Absolute: 0 10*3/uL (ref 0.0–0.1)
Basophils Relative: 0 % (ref 0–1)
Eosinophils Absolute: 0.2 10*3/uL (ref 0.0–0.7)
Monocytes Absolute: 0.8 10*3/uL (ref 0.1–1.0)
Monocytes Relative: 6 % (ref 3–12)
Neutro Abs: 9.4 10*3/uL — ABNORMAL HIGH (ref 1.7–7.7)
Neutrophils Relative %: 62 % (ref 43–77)

## 2011-03-15 LAB — URINALYSIS, ROUTINE W REFLEX MICROSCOPIC
Bilirubin Urine: NEGATIVE
Ketones, ur: NEGATIVE mg/dL
Leukocytes, UA: NEGATIVE
Nitrite: NEGATIVE
Protein, ur: NEGATIVE mg/dL
Urobilinogen, UA: 0.2 mg/dL (ref 0.0–1.0)

## 2011-03-15 LAB — URINE MICROSCOPIC-ADD ON

## 2011-03-15 MED ORDER — TRAMADOL HCL 50 MG PO TABS
50.0000 mg | ORAL_TABLET | Freq: Four times a day (QID) | ORAL | Status: AC | PRN
Start: 2011-03-15 — End: 2011-03-25

## 2011-03-15 MED ORDER — KETOROLAC TROMETHAMINE 30 MG/ML IJ SOLN
60.0000 mg | Freq: Once | INTRAMUSCULAR | Status: AC
  Administered 2011-03-15: 60 mg via INTRAMUSCULAR
  Filled 2011-03-15: qty 2

## 2011-03-15 MED ORDER — CIPROFLOXACIN HCL 500 MG PO TABS: 500.0000 mg | ORAL_TABLET | Freq: Two times a day (BID) | ORAL | Status: AC

## 2011-03-15 NOTE — Op Note (Signed)
OPERATIVE NOTE  Lindsay Pineda  DOB:    1980-04-29  MRN:    [id number]  CSN:    [id number]  Date of Surgery:  02/25/2011  Preoperative Diagnosis:  Cervical intraepithelial neoplasia II  Morbid obesity  Postoperative Diagnosis:  Same  Procedure:  Cold knife conization of the cervix  Surgeon:  Leonard Schwartz, M.D.  Assistant:  None  Anesthetic:  Gen.  Disposition:  The patient is a 31 year old female, para 3-0-0-3, who presents with the above-mentioned diagnosis. She understands the indications for surgical procedure and she accepts the risk of, but not limited to, anesthetic complications, bleeding, infections, and possible damage to the surrounding organs.  Findings:  The uterus is upper limits normal size. No adnexal masses were appreciated. All nonstaining areas were removed.  Procedure:  The patient was taken to the operating room where a general anesthetic was given. The patient lower abdomen, perineum, and vagina were prepped with multiple layers of Betadine. The bladder was drained of urine. The patient was sterilely draped. Examination under anesthesia was performed. A paracervical block was placed using 10 cc of half percent Marcaine with epinephrine. Lugol solution was placed on the cervix. Angle sutures were placed at the 3:00 and 9:00 positions. The uterus was sounded. A circumferential incision was made around the cervical os incorporating all nonstaining areas. The incision was extended in a conelike fashion to the endocervical os. Inverting sutures were placed in the cervix at the 12:00, 3:00, 6:00, and 9:00 positions. Hemostasis was confirmed throughout. The patient was returned to the supine position. She was awakened from her anesthetic without difficulty. She was transported to the recovery room in stable condition. The cone specimen was sent to pathology. The estimated blood loss was less than 50 cc.  (This operative note was dictated  again on 03/15/2011 because I was unable to find the original operative note that was dictated on 02/25/2011).  Leonard Schwartz, M.D.

## 2011-03-15 NOTE — Progress Notes (Signed)
Patient states she started having left lower abdominal pain that radiates to left back this morning. Had surgery to remove part of her cervix in December.

## 2011-03-15 NOTE — ED Notes (Signed)
Pt had post op visit with Dr. Stefano Gaul yesterday, received a prescription for diflucan & took it last night.

## 2011-03-15 NOTE — Consult Note (Signed)
Emergency room consult note  Lindsay Pineda  Date of birth:    Dec 23, 1980  Medical records number:  [id number]  Today's date:    03/15/2011  History of present illness:  The patient is a 31 year old female, para 3-0-0-3, who presents to the maternity admissions area at the [facility] hospital in [place] complaining of left low quadrant pain that started last night. The patient has been followed at the [facility] [place] [facility]. The patient had a cold knife conization of the cervix on 02/25/2011. The pathology report showed cervical intraepithelial neoplasia II. The procedure was uncomplicated. The patient did well postoperatively. The patient was seen in our office on December 31 of 2012 and at that time was without major complaints. She reports that she started having severe left lower quadrant pain last night and decided to come to the emergency room today. She has had normal bowel function. She denies rectal bleeding. She complains of dysuria but no other urinary tract symptoms. She does have a history of kidney stones. The patient has had a tubal ligation in the past. She has had nausea but no vomiting.  Obstetrical history:  The patient has had 3 term vaginal deliveries.  Past medical history:  The patient's weight is greater than 300 pounds. The patient has borderline hypertension. She had a cholecystectomy in 2007. She had her wisdom teeth removed in 2008.  Drug allergies:  The patient reports that Percocet causes hallucinations.  Social history:  The patient is a past cigarette smoker. She denies other recreational drug use is.  Review of systems:  See history of present illness.  Family history:  Noncontributory.  Physical exam:  BP 150/93  Pulse 74  Temp(Src) 98.8 F (37.1 C) (Oral)  Resp 20  Ht 5' 4.5" (1.638 m)  Wt 128.549 kg (283 lb 6.4 oz)  BMI 47.89 kg/m2  SpO2 96%  LMP  02/24/2011  HEENT:  Normal  Chest:  Clear  Heart:  Regular rate and rhythm  Abdomen:  Obese. Vague left lower quadrant tenderness. No guarding or rebound. Bowel sounds are normal.  Back:  Mild left flank tenderness.  Extremities:  Normal  Pelvic exam:  External genitalia: Normal  Vagina: Discharge present.  Cervix: Healing well  Uterus: Difficult to outline. Slight tenderness to the left of the midline.  Adnexa: No masses appreciated. Tender on the left  Assessment:  Left lower quadrant tenderness of uncertain etiology  Status post conization of the cervix on 02/25/2011 for cervical intraepithelial neoplasia II  Obesity  Dysuria  History of kidney stones  Plan:  CBC, basic metabolic panel, catheter urinalysis  Intramuscular Toradol 60 mg  The patient may need a CT scan or an ultrasound depending upon lab results.  Mylinda Latina.D.

## 2011-03-15 NOTE — Progress Notes (Signed)
MD notified pt feels better, has no pain @ present, notified of lab results - is coming to see pt.

## 2011-03-15 NOTE — Consult Note (Signed)
Subjective:  The patient feels better after intramuscular Toradol.  Objective:  BP 150/93  Pulse 74  Temp(Src) 98.8 F (37.1 C) (Oral)  Resp 20  Ht 5' 4.5" (1.638 m)  Wt 128.549 kg (283 lb 6.4 oz)  BMI 47.89 kg/m2  SpO2 96%  LMP 02/24/2011  CBC    Component Value Date/Time   WBC 15.1* 03/15/2011 1440   WBC 15.1* 02/24/2011 1515   RBC 4.34 03/15/2011 1440   RBC 4.57 02/24/2011 1515   HGB 11.9* 03/15/2011 1440   HGB 12.1 02/24/2011 1515   HCT 37.0 03/15/2011 1440   HCT 38.0 02/24/2011 1515   PLT 298 03/15/2011 1440   PLT 273 02/24/2011 1515   MCV 85.3 03/15/2011 1440   MCV 83.2 02/24/2011 1515   MCH 27.4 03/15/2011 1440   MCH 26.5 02/24/2011 1515   MCHC 32.2 03/15/2011 1440   MCHC 31.8 02/24/2011 1515   RDW 14.7 03/15/2011 1440   RDW 15.3* 02/24/2011 1515   LYMPHSABS 4.6* 03/15/2011 1440   LYMPHSABS 3.8* 02/24/2011 1515   MONOABS 0.8 03/15/2011 1440   MONOABS 0.7 02/24/2011 1515   EOSABS 0.2 03/15/2011 1440   EOSABS 0.1 02/24/2011 1515   BASOSABS 0.0 03/15/2011 1440   BASOSABS 0.0 02/24/2011 1515    BMET    Component Value Date/Time   NA 134* 03/15/2011 1440   K 3.7 03/15/2011 1440   CL 103 03/15/2011 1440   CO2 25 03/15/2011 1440   GLUCOSE 93 03/15/2011 1440   BUN 7 03/15/2011 1440   CREATININE 0.63 03/15/2011 1440   CALCIUM 8.9 03/15/2011 1440   GFRNONAA >90 03/15/2011 1440   GFRAA >90 03/15/2011 1440    Urine analysis:   Results for Lindsay Pineda, Lindsay Pineda (MRN [id number]) as of 03/15/2011 16:12  Ref. Range 03/15/2011 14:35  Color, Urine Latest Range: YELLOW  YELLOW  APPearance Latest Range: CLEAR  CLEAR  Specific Gravity, Urine Latest Range: 1.005-1.030  1.015  pH Latest Range: 5.0-8.0  7.0  Glucose, UA Latest Range: NEGATIVE mg/dL NEGATIVE  Bilirubin Urine Latest Range: NEGATIVE  NEGATIVE  Ketones, ur Latest Range: NEGATIVE mg/dL NEGATIVE  Protein Latest Range: NEGATIVE mg/dL NEGATIVE  Urobilinogen, UA Latest Range: 0.0-1.0 mg/dL 0.2  Nitrite Latest Range: NEGATIVE  NEGATIVE  Leukocytes,  UA Latest Range: NEGATIVE  NEGATIVE  Urine-Other No range found AMORPHOUS URATES/PHOSPHATES  WBC, UA Latest Range: <3 WBC/hpf 0-2  RBC / HPF Latest Range: <3 RBC/hpf 3-6  Squamous Epithelial / LPF Latest Range: RARE  FEW (A)  Hgb urine dipstick Latest Range: NEGATIVE  MODERATE (A)    Assessment:  Improved left lower quadrant pain  Urinary tract infection  Possible left kidney stone  Plan:  The patient will be discharged to home.  Urine culture sent.  Ciprofloxacin 500 mg twice each day for 7 days.  Ultram 50 mg every 4-6 hours as needed for pain.  The patient will return to see Dr. Stefano Gaul one week. She will call if her condition worsens.  Mylinda Latina.D.

## 2011-03-16 LAB — URINE CULTURE
Colony Count: NO GROWTH
Culture  Setup Time: 201301020007
Culture: NO GROWTH

## 2011-07-27 ENCOUNTER — Other Ambulatory Visit: Payer: Self-pay | Admitting: Obstetrics and Gynecology

## 2011-07-27 NOTE — Telephone Encounter (Signed)
Spoke with pt rgd msg pt wants rx for diflucan called to pharm advised pt need appt for eval first offered pt an appt pt declined appt

## 2011-10-05 ENCOUNTER — Encounter: Payer: Self-pay | Admitting: Obstetrics and Gynecology

## 2011-10-05 ENCOUNTER — Ambulatory Visit (INDEPENDENT_AMBULATORY_CARE_PROVIDER_SITE_OTHER): Payer: 59 | Admitting: Obstetrics and Gynecology

## 2011-10-05 VITALS — BP 140/90 | Resp 14 | Ht 64.0 in | Wt 306.0 lb

## 2011-10-05 DIAGNOSIS — N390 Urinary tract infection, site not specified: Secondary | ICD-10-CM

## 2011-10-05 NOTE — Progress Notes (Signed)
Color: white Odor: no Itching:yes Thin:yes Thick:no Fever:no Dyspareunia:no Hx PID:no HX STD:yes Pelvic Pain:no Desires Gc/CT:yes Desires HIV,RPR,HbsAG:yes  *c/o possible UTI x 3 days   Patient presented for possible UTI. When she arrived she requested a Annual examination and STD Screen but did not want a Breast examination. With further investigation, it appeared that she was s/p Cone Biopsy with Dr Stefano Gaul  And should really f/u Pap with him to have conclusive care for her condition. She has a scheduled appointment to see Dr Stefano Gaul on 11/06/11 as f/u for this condition and did want to compromise the plan of care.  She demanded STD blood work but left it too late for the lab to draw the samples and was advised that [facility] would be the only place that could do this testing at this hour of the day. Patient is now scheduled for STD Blood screening at 13.00hrs tomorrow and will f/u with Dr Stefano Gaul as scheduled. She also had requested a refill of Lasix medication and Diflucan with examination of which was declined as patient was not examined. The patient was offered to have an add on CMP to blood work to check Potassium level tomorrow and a examination at the same while in the office if would like to be seen at this time. The urinalysis at the time of visit was Leukocytes +2 and Urobiligen and this is associated with dehydration. Patient has been advised  Drink water to help with hydration. The outcome is that the patient will f/u with Dr Stefano Gaul  11/06/11 and f/u for Blood work tomorrow 10/06/11. Luka Stohr,CNM.   She stated that she

## 2011-10-06 NOTE — Addendum Note (Signed)
Addended by: Janeece Agee on: 10/06/2011 02:36 PM   Modules accepted: Orders

## 2011-10-16 ENCOUNTER — Encounter (HOSPITAL_COMMUNITY): Payer: Self-pay | Admitting: Emergency Medicine

## 2011-10-16 ENCOUNTER — Emergency Department (HOSPITAL_COMMUNITY)
Admission: EM | Admit: 2011-10-16 | Discharge: 2011-10-17 | Disposition: A | Discharge status: Home / Self Care | Payer: 59 | Attending: Emergency Medicine | Admitting: Emergency Medicine

## 2011-10-16 DIAGNOSIS — E559 Vitamin D deficiency, unspecified: Secondary | ICD-10-CM | POA: Insufficient documentation

## 2011-10-16 DIAGNOSIS — Z87442 Personal history of urinary calculi: Secondary | ICD-10-CM | POA: Insufficient documentation

## 2011-10-16 DIAGNOSIS — R609 Edema, unspecified: Secondary | ICD-10-CM | POA: Insufficient documentation

## 2011-10-16 DIAGNOSIS — K219 Gastro-esophageal reflux disease without esophagitis: Secondary | ICD-10-CM | POA: Insufficient documentation

## 2011-10-16 DIAGNOSIS — M79609 Pain in unspecified limb: Secondary | ICD-10-CM | POA: Insufficient documentation

## 2011-10-16 DIAGNOSIS — I10 Essential (primary) hypertension: Secondary | ICD-10-CM | POA: Insufficient documentation

## 2011-10-16 DIAGNOSIS — R600 Localized edema: Secondary | ICD-10-CM

## 2011-10-16 DIAGNOSIS — Z87440 Personal history of urinary (tract) infections: Secondary | ICD-10-CM | POA: Insufficient documentation

## 2011-10-16 NOTE — ED Notes (Signed)
Pt c/o bilat swelling to lower ext, worse since medication change last Tuesday pt also c/o pain to LLE radiating up to L hip  Onset today

## 2011-10-17 ENCOUNTER — Ambulatory Visit (HOSPITAL_COMMUNITY)
Admission: RE | Admit: 2011-10-17 | Discharge: 2011-10-17 | Disposition: A | Discharge status: Home / Self Care | Payer: 59 | Source: Ambulatory Visit | Attending: Emergency Medicine | Admitting: Emergency Medicine

## 2011-10-17 DIAGNOSIS — M79609 Pain in unspecified limb: Secondary | ICD-10-CM | POA: Insufficient documentation

## 2011-10-17 DIAGNOSIS — M7989 Other specified soft tissue disorders: Secondary | ICD-10-CM

## 2011-10-17 DIAGNOSIS — R609 Edema, unspecified: Secondary | ICD-10-CM

## 2011-10-17 DIAGNOSIS — R52 Pain, unspecified: Secondary | ICD-10-CM

## 2011-10-17 LAB — POCT I-STAT, CHEM 8
BUN: 11 mg/dL (ref 6–23)
Calcium, Ion: 1.23 mmol/L (ref 1.12–1.23)
HCT: 37 % (ref 36.0–46.0)
Hemoglobin: 12.6 g/dL (ref 12.0–15.0)
Sodium: 139 mEq/L (ref 135–145)
TCO2: 25 mmol/L (ref 0–100)

## 2011-10-17 LAB — CBC
MCH: 26.3 pg (ref 26.0–34.0)
MCHC: 32.3 g/dL (ref 30.0–36.0)
MCV: 81.5 fL (ref 78.0–100.0)
Platelets: 282 10*3/uL (ref 150–400)
RDW: 14.2 % (ref 11.5–15.5)

## 2011-10-17 MED ORDER — IBUPROFEN 800 MG PO TABS
800.0000 mg | ORAL_TABLET | Freq: Once | ORAL | Status: AC
  Administered 2011-10-17: 800 mg via ORAL
  Filled 2011-10-17: qty 1

## 2011-10-17 MED ORDER — ENOXAPARIN SODIUM 150 MG/ML ~~LOC~~ SOLN
140.0000 mg | Freq: Two times a day (BID) | SUBCUTANEOUS | Status: DC
  Administered 2011-10-17: 140 mg via SUBCUTANEOUS
  Filled 2011-10-17 (×3): qty 1

## 2011-10-17 MED ORDER — FUROSEMIDE 40 MG PO TABS
40.0000 mg | ORAL_TABLET | Freq: Once | ORAL | Status: AC
  Administered 2011-10-17: 40 mg via ORAL
  Filled 2011-10-17: qty 1

## 2011-10-17 NOTE — Progress Notes (Signed)
*  PRELIMINARY RESULTS* Vascular Ultrasound Left lower extremity venous duplex has been completed.  Preliminary findings: Left= no evidence of DVT or baker's cyst.  Farrel Demark, RDMS, RVT  10/17/2011, 10:29 AM

## 2011-10-17 NOTE — ED Provider Notes (Signed)
History     CSN: [id number]  Arrival date & time 10/16/11  2049   First MD Initiated Contact with Patient 10/17/11 [phone]      Chief Complaint  Patient presents with  . Leg Swelling    (Consider location/radiation/quality/duration/timing/severity/associated sxs/prior treatment) Patient is a 31 y.o. female presenting with leg pain. The history is provided by the patient. No language interpreter was used.  Leg Pain  The incident occurred 12 to 24 hours ago. The incident occurred at home. There was no injury mechanism. The pain is present in the left leg. The quality of the pain is described as throbbing. The pain is at a severity of 6/10. The pain is moderate. The pain has been constant since onset. Associated symptoms include loss of motion. Pertinent negatives include no numbness, no inability to bear weight, no muscle weakness, no loss of sensation and no tingling. She reports no foreign bodies present. She has tried nothing for the symptoms.   31 year old obese female coming in with complaint of bilateral lower extremity edema. Patient states that she goes to [facility] and they changed her Lasix to hydrochlorothiazide and since then the swelling has gotten worse. Patient states that she woke up this morning and she had pain in the left lower extremity which concerned her for a DVT. An RN friend told her to come to the ER. She is here today to be evaluated for DVT in the left lower extremity. 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 The pain is to her right lateral ankle above the patella and left hip area. Past medical history UTI, headaches, anxiety, GERD, acute psychosis, hypertension, arthritis. No calf pain, long trips or SOB.  Past Medical History  Diagnosis Date  . UTI (lower urinary tract infection)     currently being tx - finished abx 02/15/11   . Headache     imitrex - 02/11/11    . Anxiety     no meds  . GERD (gastroesophageal reflux disease)     diet controlled - no med  . Bacterial infection     currently - finishing meds 02/15/11  . Anemia   . Vitamin d deficiency   . Leukocytosis   . Vaginal yeast infection     recurrent at least 5x/year.   . Hypertension   . Kidney stone     Past Surgical History  Procedure Date  . Cholecystectomy   . Tubal ligation   . Dental surgery     wisdom teeth ext  . Svd     x 3  . Colonscopy   . Cervical conization w/bx 02/25/2011    Procedure: CONIZATION CERVIX WITH BIOPSY;  Surgeon: Janine Limbo, MD;  Location: [facility];  Service: Gynecology;  Laterality: N/A;    Family History  Problem Relation Age of Onset  . Hypothyroidism Mother   . Thyroid disease Mother   . Hypertension Father   . Diabetes Father   . Cancer Maternal Grandmother     colon cancer    History  Substance Use Topics  . Smoking status: Former Smoker -- 0.2 packs/day for 15 years    Types: Cigarettes    Quit date: 05/23/2011  . Smokeless tobacco: Never Used  . Alcohol Use: 0.5 oz/week    1 drink(s) per week     socially    OB History  Grav Para Term Preterm Abortions TAB SAB Ect Mult Living   3 3 3       3       Review of Systems  Constitutional: Negative.   HENT: Negative.   Eyes: Negative.   Respiratory: Negative.  Negative for shortness of breath.   Cardiovascular: Negative.  Negative for chest pain.  Gastrointestinal: Negative.  Negative for nausea, vomiting, abdominal pain and abdominal distention.  Musculoskeletal: Negative for back pain and gait problem.  Neurological: Negative.  Negative for dizziness, tingling, weakness, numbness and headaches.  Psychiatric/Behavioral: Negative.   All other systems reviewed and are negative.    Allergies  Percocet  Home Medications   Current Outpatient Rx  Name Route Sig Dispense Refill  . ALPRAZOLAM 0.5 MG PO TABS Oral Take 0.5 mg by mouth 3 (three) times daily as needed.  For anxiety and sleep    . LISINOPRIL-HYDROCHLOROTHIAZIDE 20-12.5 MG PO TABS Oral Take 1 tablet by mouth daily.      BP 140/87  Pulse 85  Temp 97.7 F (36.5 C) (Oral)  Resp 18  SpO2 98%  LMP 09/21/2011  Physical Exam  Nursing note and vitals reviewed. Constitutional: She is oriented to person, place, and time. She appears well-developed and well-nourished.  HENT:  Head: Normocephalic and atraumatic.  Eyes: Conjunctivae and EOM are normal. Pupils are equal, round, and reactive to light.  Neck: Normal range of motion. Neck supple.  Cardiovascular: Normal rate.   Pulmonary/Chest: Effort normal and breath sounds normal. No respiratory distress. She has no wheezes.  Abdominal: Soft. Bowel sounds are normal. She exhibits no distension.  Musculoskeletal: Normal range of motion. She exhibits edema and tenderness.       Bilateral 1+ pitting edema to lower extremities.  Tenderness to L ankle, knee and hip no bruising 2+ bpp  Neurological: She is alert and oriented to person, place, and time. She has normal reflexes.  Skin: Skin is warm and dry.  Psychiatric: She has a normal mood and affect.    ED Course  Procedures (including critical care time)  Labs Reviewed  POCT I-STAT, CHEM 8 - Abnormal; Notable for the following:    Potassium 3.4 (*)     All other components within normal limits  CBC   No results found.   No diagnosis found.    MDM  31 yo female with concern for LLE dvt with 1+pitting edema to bilateral LE.  Doubt DVT on the L.  No calf pain, SOB or chest pain.  Received lovenox in ER tonight.  Will return to vascular lab in the am.  Received lasix 40mg  in ER for LE edema.  Advised to elevate feet intermittantly for edema. Labs unremarkable. Labs Reviewed  CBC - Abnormal; Notable for the following:    WBC 14.5 (*)     Hemoglobin 11.4 (*)     HCT 35.3 (*)     All other components within normal limits  POCT I-STAT, CHEM 8 - Abnormal; Notable for the following:     Potassium 3.4 (*)     All other components within normal limits   1300  Doppler study - for dvt.  Patient notified.  Continue all meds.  Follow up with pcp as needed. Patient notified at home of the results.        Remi Haggard, NP 10/17/11 1540

## 2011-10-17 NOTE — ED Notes (Signed)
Pt states that she has been having swelling in her legs that usually is controlled with Lasix. Since switching to Lisinopril, it has not gone down. She also began having pain in her left leg that radiates up to her hip.

## 2011-10-18 NOTE — ED Provider Notes (Signed)
Medical screening examination/treatment/procedure(s) were performed by non-physician practitioner and as supervising physician I was immediately available for consultation/collaboration.    Vida Roller, MD 10/18/11 [phone]

## 2011-11-03 ENCOUNTER — Encounter: Payer: 59 | Admitting: Obstetrics and Gynecology

## 2011-11-28 ENCOUNTER — Ambulatory Visit: Payer: 59 | Admitting: Oncology

## 2012-08-16 ENCOUNTER — Other Ambulatory Visit (HOSPITAL_COMMUNITY)
Admission: RE | Admit: 2012-08-16 | Discharge: 2012-08-16 | Disposition: A | Discharge status: Home / Self Care | Payer: 59 | Source: Ambulatory Visit | Attending: Physician Assistant | Admitting: Physician Assistant

## 2012-08-16 ENCOUNTER — Other Ambulatory Visit: Payer: Self-pay | Admitting: Physician Assistant

## 2012-08-16 DIAGNOSIS — Z113 Encounter for screening for infections with a predominantly sexual mode of transmission: Secondary | ICD-10-CM | POA: Insufficient documentation

## 2012-08-16 DIAGNOSIS — Z01419 Encounter for gynecological examination (general) (routine) without abnormal findings: Secondary | ICD-10-CM | POA: Insufficient documentation

## 2014-01-13 ENCOUNTER — Encounter (HOSPITAL_COMMUNITY): Payer: Self-pay | Admitting: Emergency Medicine

## 2014-10-20 ENCOUNTER — Emergency Department (HOSPITAL_COMMUNITY)
Admission: EM | Admit: 2014-10-20 | Discharge: 2014-10-20 | Disposition: A | Discharge status: Home / Self Care | Payer: BLUE CROSS/BLUE SHIELD | Source: Home / Self Care | Attending: Family Medicine | Admitting: Family Medicine

## 2014-10-20 ENCOUNTER — Encounter (HOSPITAL_COMMUNITY): Payer: Self-pay | Admitting: Emergency Medicine

## 2014-10-20 DIAGNOSIS — N200 Calculus of kidney: Secondary | ICD-10-CM | POA: Diagnosis not present

## 2014-10-20 DIAGNOSIS — N390 Urinary tract infection, site not specified: Secondary | ICD-10-CM

## 2014-10-20 LAB — POCT URINALYSIS DIP (DEVICE)
BILIRUBIN URINE: NEGATIVE
GLUCOSE, UA: NEGATIVE mg/dL
KETONES UR: NEGATIVE mg/dL
NITRITE: NEGATIVE
PH: 6.5 (ref 5.0–8.0)
Protein, ur: 100 mg/dL — AB
SPECIFIC GRAVITY, URINE: 1.025 (ref 1.005–1.030)
UROBILINOGEN UA: 0.2 mg/dL (ref 0.0–1.0)

## 2014-10-20 LAB — POCT PREGNANCY, URINE: Preg Test, Ur: NEGATIVE

## 2014-10-20 MED ORDER — TAMSULOSIN HCL 0.4 MG PO CAPS: 0.4000 mg | ORAL_CAPSULE | Freq: Every day | ORAL | Status: AC

## 2014-10-20 MED ORDER — IBUPROFEN 600 MG PO TABS: 600.0000 mg | ORAL_TABLET | Freq: Four times a day (QID) | ORAL | Status: AC | PRN

## 2014-10-20 MED ORDER — KETOROLAC TROMETHAMINE 60 MG/2ML IM SOLN
60.0000 mg | Freq: Once | INTRAMUSCULAR | Status: AC
  Administered 2014-10-20: 60 mg via INTRAMUSCULAR

## 2014-10-20 MED ORDER — FLUCONAZOLE 150 MG PO TABS: 150.0000 mg | ORAL_TABLET | Freq: Every day | ORAL | Status: AC

## 2014-10-20 MED ORDER — CEPHALEXIN 500 MG PO CAPS: 500.0000 mg | ORAL_CAPSULE | Freq: Three times a day (TID) | ORAL | Status: DC

## 2014-10-20 MED ORDER — KETOROLAC TROMETHAMINE 60 MG/2ML IM SOLN
INTRAMUSCULAR | Status: AC
  Filled 2014-10-20: qty 2

## 2014-10-20 NOTE — Discharge Instructions (Signed)
You likely have a kidney stone as well as a UTI. Please start the antibiotics as prescribed. Please start the Flomax to help with the urine flow. Please drink a lot of water and strain your urine. Your given a dose of Toradol here to help with your pain and inflammation for the next 24 hours. He may resume ibuprofen after 24 hours. Pain is not improving he'll need to go to the emergency room or to urology to help get rid of the stone.

## 2014-10-20 NOTE — ED Provider Notes (Signed)
CSN: [id number]     Arrival date & time 10/20/14  1828 History   None    Chief Complaint  Patient presents with  . Urinary Tract Infection   (Consider location/radiation/quality/duration/timing/severity/associated sxs/prior Treatment) HPI  R side pain. Started 1 week ago. Pain comes and goes. Radiates to the pubic region. Dysuria. Getting worse. Frequency. Denies fevers. Last period more than a week ago. Denies nausea, vomiting, diarrhea, constipation, fevers, vaginal discharge. History of nephrolithiasis and told she has multiple 1-3 mm stones in her kidneys by previous urologists. Nothing makes her symptoms better. Sometimes worsened with certain movements.   Past Medical History  Diagnosis Date  . UTI (lower urinary tract infection)     currently being tx - finished abx 02/15/11   . WUJWJXBJ(478.2)     imitrex - 02/11/11  . Anxiety     no meds  . GERD (gastroesophageal reflux disease)     diet controlled - no med  . Bacterial infection     currently - finishing meds 02/15/11  . Anemia   . Vitamin D deficiency   . Leukocytosis   . Vaginal yeast infection     recurrent at least 5x/year.   . Hypertension   . Kidney stone    Past Surgical History  Procedure Laterality Date  . Cholecystectomy    . Tubal ligation    . Dental surgery      wisdom teeth ext  . Svd      x 3  . Colonscopy    . Cervical conization w/bx  02/25/2011    Procedure: CONIZATION CERVIX WITH BIOPSY;  Surgeon: Janine Limbo, MD;  Location: [facility];  Service: Gynecology;  Laterality: N/A;   Family History  Problem Relation Age of Onset  . Hypothyroidism Mother   . Thyroid disease Mother   . Hypertension Father   . Diabetes Father   . Cancer Maternal Grandmother     colon cancer   History  Substance Use Topics  . Smoking status: Former Smoker -- 0.25 packs/day for 15 years    Types: Cigarettes    Quit date: 05/23/2011  . Smokeless tobacco: Never Used  . Alcohol Use: 0.5 oz/week    1 drink(s)  per week     Comment: socially   OB History    Gravida Para Term Preterm AB TAB SAB Ectopic Multiple Living   3 3 3       3      Review of Systems Per HPI with all other pertinent systems negative.    Allergies  Percocet  Home Medications   Prior to Admission medications   Medication Sig Start Date End Date Taking? Authorizing Provider  lisinopril-hydrochlorothiazide (PRINZIDE,ZESTORETIC) 20-12.5 MG per tablet Take 1 tablet by mouth daily.   Yes Historical Provider, MD  phentermine 15 MG capsule Take 15 mg by mouth every morning.   Yes Historical Provider, MD  ALPRAZolam Prudy Feeler) 0.5 MG tablet Take 0.5 mg by mouth 3 (three) times daily as needed. For anxiety and sleep    Historical Provider, MD  cephALEXin (KEFLEX) 500 MG capsule Take 1 capsule (500 mg total) by mouth 3 (three) times daily. 10/20/14   Ozella Rocks, MD  fluconazole (DIFLUCAN) 150 MG tablet Take 1 tablet (150 mg total) by mouth daily. Repeat dose in 3 days 10/20/14   Ozella Rocks, MD  ibuprofen (ADVIL,MOTRIN) 600 MG tablet Take 1 tablet (600 mg total) by mouth every 6 (six) hours as needed. 10/20/14   Onalee Hua  Mauri Reading, MD  tamsulosin (FLOMAX) 0.4 MG CAPS capsule Take 1 capsule (0.4 mg total) by mouth daily after breakfast. 10/20/14   Ozella Rocks, MD   BP 141/71 mmHg  Pulse 92  Temp(Src) 98.4 F (36.9 C) (Oral)  Resp 16  SpO2 99%  LMP 10/13/2014 (Approximate) Physical Exam Physical Exam  Constitutional: oriented to person, place, and time. appears well-developed and well-nourished. No distress.  HENT:  Head: Normocephalic and atraumatic.  Eyes: EOMI. PERRL.  Neck: Normal range of motion.  Cardiovascular: RRR, no m/r/g, 2+ distal pulses,  Pulmonary/Chest: Effort normal and breath sounds normal. No respiratory distress.  Abdominal: Soft. Bowel sounds are normal. NonTTP, no distension.  Musculoskeletal: Mild right CVA tenderness.  Neurological: alert and oriented to person, place, and time.  Skin: Skin is warm.  No rash noted. non diaphoretic.  Psychiatric: normal mood and affect. behavior is normal. Judgment and thought content normal.   ED Course  Procedures (including critical care time) Labs Review Labs Reviewed  POCT URINALYSIS DIP (DEVICE) - Abnormal; Notable for the following:    Hgb urine dipstick MODERATE (*)    Protein, ur 100 (*)    Leukocytes, UA TRACE (*)    All other components within normal limits  URINE CULTURE  POCT PREGNANCY, URINE    Imaging Review No results found.   MDM   1. UTI (lower urinary tract infection)   2. Nephrolithiasis    Keflex, urine culture, Flomax, Toradol 60 mg IM in clinic. Start NSAIDs and 24 hours. Follow-up in the ED or at urology if not improving. Diflucan if develops [place] infection.    Ozella Rocks, MD 10/20/14 [phone]

## 2014-10-20 NOTE — ED Notes (Signed)
Pt complains of right sides front and back pain along with urgency to urinate, frequency of urination, and some nausea.  She has had a UTI in the past that felt this way, but she is also being seen by a nephrologist for Kidney Stones, but doesn't have another appointment with him until October.

## 2014-10-22 LAB — URINE CULTURE

## 2014-10-23 NOTE — ED Notes (Signed)
Final report of culture shows multiple species 

## 2015-01-05 ENCOUNTER — Other Ambulatory Visit: Payer: Self-pay | Admitting: Surgical Oncology

## 2015-01-06 ENCOUNTER — Other Ambulatory Visit (HOSPITAL_COMMUNITY)
Admission: RE | Admit: 2015-01-06 | Discharge: 2015-01-06 | Disposition: A | Discharge status: Home / Self Care | Payer: BLUE CROSS/BLUE SHIELD | Source: Ambulatory Visit | Attending: Family Medicine | Admitting: Family Medicine

## 2015-01-06 ENCOUNTER — Other Ambulatory Visit: Payer: Self-pay | Admitting: Family Medicine

## 2015-01-06 DIAGNOSIS — Z01419 Encounter for gynecological examination (general) (routine) without abnormal findings: Secondary | ICD-10-CM | POA: Diagnosis present

## 2015-01-08 LAB — CYTOLOGY - PAP

## 2015-01-14 ENCOUNTER — Ambulatory Visit
Admission: RE | Admit: 2015-01-14 | Discharge: 2015-01-14 | Disposition: A | Discharge status: Home / Self Care | Payer: BLUE CROSS/BLUE SHIELD | Source: Ambulatory Visit | Attending: Surgical Oncology | Admitting: Surgical Oncology

## 2015-04-12 ENCOUNTER — Emergency Department (HOSPITAL_COMMUNITY)
Admission: EM | Admit: 2015-04-12 | Discharge: 2015-04-12 | Disposition: A | Discharge status: Home / Self Care | Payer: BLUE CROSS/BLUE SHIELD | Attending: Emergency Medicine | Admitting: Emergency Medicine

## 2015-04-12 ENCOUNTER — Encounter (HOSPITAL_COMMUNITY): Payer: Self-pay | Admitting: Emergency Medicine

## 2015-04-12 ENCOUNTER — Emergency Department (HOSPITAL_COMMUNITY): Payer: BLUE CROSS/BLUE SHIELD

## 2015-04-12 DIAGNOSIS — N760 Acute vaginitis: Secondary | ICD-10-CM | POA: Insufficient documentation

## 2015-04-12 DIAGNOSIS — D649 Anemia, unspecified: Secondary | ICD-10-CM | POA: Insufficient documentation

## 2015-04-12 DIAGNOSIS — Z8639 Personal history of other endocrine, nutritional and metabolic disease: Secondary | ICD-10-CM | POA: Insufficient documentation

## 2015-04-12 DIAGNOSIS — Z8619 Personal history of other infectious and parasitic diseases: Secondary | ICD-10-CM | POA: Diagnosis not present

## 2015-04-12 DIAGNOSIS — Z87442 Personal history of urinary calculi: Secondary | ICD-10-CM | POA: Diagnosis not present

## 2015-04-12 DIAGNOSIS — B9689 Other specified bacterial agents as the cause of diseases classified elsewhere: Secondary | ICD-10-CM

## 2015-04-12 DIAGNOSIS — Z87891 Personal history of nicotine dependence: Secondary | ICD-10-CM | POA: Diagnosis not present

## 2015-04-12 DIAGNOSIS — I10 Essential (primary) hypertension: Secondary | ICD-10-CM | POA: Diagnosis not present

## 2015-04-12 DIAGNOSIS — K219 Gastro-esophageal reflux disease without esophagitis: Secondary | ICD-10-CM | POA: Insufficient documentation

## 2015-04-12 DIAGNOSIS — Z8659 Personal history of other mental and behavioral disorders: Secondary | ICD-10-CM | POA: Diagnosis not present

## 2015-04-12 DIAGNOSIS — Z79899 Other long term (current) drug therapy: Secondary | ICD-10-CM | POA: Diagnosis not present

## 2015-04-12 DIAGNOSIS — R1030 Lower abdominal pain, unspecified: Secondary | ICD-10-CM | POA: Diagnosis present

## 2015-04-12 DIAGNOSIS — R102 Pelvic and perineal pain: Secondary | ICD-10-CM

## 2015-04-12 DIAGNOSIS — N390 Urinary tract infection, site not specified: Secondary | ICD-10-CM | POA: Diagnosis not present

## 2015-04-12 LAB — URINALYSIS, ROUTINE W REFLEX MICROSCOPIC
GLUCOSE, UA: NEGATIVE mg/dL
NITRITE: NEGATIVE
PROTEIN: 100 mg/dL — AB
Specific Gravity, Urine: 1.022 (ref 1.005–1.030)
pH: 6 (ref 5.0–8.0)

## 2015-04-12 LAB — COMPREHENSIVE METABOLIC PANEL
ALBUMIN: 3.2 g/dL — AB (ref 3.5–5.0)
ALT: 51 U/L (ref 14–54)
ANION GAP: 10 (ref 5–15)
AST: 34 U/L (ref 15–41)
Alkaline Phosphatase: 57 U/L (ref 38–126)
BILIRUBIN TOTAL: 0.7 mg/dL (ref 0.3–1.2)
BUN: 7 mg/dL (ref 6–20)
CALCIUM: 9.3 mg/dL (ref 8.9–10.3)
CO2: 24 mmol/L (ref 22–32)
Chloride: 106 mmol/L (ref 101–111)
Creatinine, Ser: 0.77 mg/dL (ref 0.44–1.00)
GFR calc non Af Amer: >60 mL/min (ref >60)
GLUCOSE: 84 mg/dL (ref 65–99)
POTASSIUM: 3.9 mmol/L (ref 3.5–5.1)
SODIUM: 140 mmol/L (ref 135–145)
TOTAL PROTEIN: 7.4 g/dL (ref 6.5–8.1)

## 2015-04-12 LAB — WET PREP, GENITAL
Sperm: NONE SEEN
Trich, Wet Prep: NONE SEEN
Yeast Wet Prep HPF POC: NONE SEEN

## 2015-04-12 LAB — URINE MICROSCOPIC-ADD ON

## 2015-04-12 LAB — CBC
HEMATOCRIT: 38.9 % (ref 36.0–46.0)
HEMOGLOBIN: 12 g/dL (ref 12.0–15.0)
MCH: 26.1 pg (ref 26.0–34.0)
MCHC: 30.8 g/dL (ref 30.0–36.0)
MCV: 84.7 fL (ref 78.0–100.0)
Platelets: 333 10*3/uL (ref 150–400)
RBC: 4.59 MIL/uL (ref 3.87–5.11)
RDW: 15.4 % (ref 11.5–15.5)
WBC: 14.1 10*3/uL — ABNORMAL HIGH (ref 4.0–10.5)

## 2015-04-12 LAB — LIPASE, BLOOD: Lipase: 56 U/L — ABNORMAL HIGH (ref 11–51)

## 2015-04-12 LAB — I-STAT BETA HCG BLOOD, ED (MC, WL, AP ONLY): I-stat hCG, quantitative: <5 m[IU]/mL (ref <5)

## 2015-04-12 MED ORDER — KETOROLAC TROMETHAMINE 30 MG/ML IJ SOLN
30.0000 mg | Freq: Once | INTRAMUSCULAR | Status: AC
  Administered 2015-04-12: 30 mg via INTRAVENOUS
  Filled 2015-04-12: qty 1

## 2015-04-12 MED ORDER — ONDANSETRON HCL 4 MG/2ML IJ SOLN
4.0000 mg | Freq: Once | INTRAMUSCULAR | Status: AC
  Administered 2015-04-12: 4 mg via INTRAVENOUS
  Filled 2015-04-12: qty 2

## 2015-04-12 MED ORDER — METRONIDAZOLE 500 MG PO TABS: 500.0000 mg | ORAL_TABLET | Freq: Two times a day (BID) | ORAL | Status: AC

## 2015-04-12 MED ORDER — CEPHALEXIN 500 MG PO CAPS: 500.0000 mg | ORAL_CAPSULE | Freq: Four times a day (QID) | ORAL | Status: AC

## 2015-04-12 NOTE — ED Provider Notes (Signed)
CSN: [id number]     Arrival date & time 04/12/15  1044 History   First MD Initiated Contact with Patient 04/12/15 1144     Chief Complaint  Patient presents with  . Abdominal Pain     (Consider location/radiation/quality/duration/timing/severity/associated sxs/prior Treatment) Patient is a 35 y.o. female presenting with abdominal pain. The history is provided by the patient.  Abdominal Pain Pain location:  Suprapubic Pain quality: sharp and shooting   Pain radiates to:  Does not radiate Pain severity:  Severe Onset quality:  Gradual Duration:  2 weeks Timing:  Intermittent Progression:  Waxing and waning Chronicity:  Recurrent Relieved by:  Nothing Worsened by:  Nothing tried Ineffective treatments:  None tried Associated symptoms: dysuria   Associated symptoms: no anorexia, no chest pain, no chills, no diarrhea, no fever, no nausea, no shortness of breath and no vomiting   Risk factors: multiple surgeries    35 yo F  With a chief complaints of suprapubic abdominal pain. This been going on for the past couple weeks. Patient states it feels like a kidney stone. In the suprapubic, constant but comes with some sharp pains from time to time.  Denies radiation of the pain. Nothing seems to make it better or worse. Patient having some dysuria increased frequency and hesitancy as well. Denies fevers or chills.  States is normal for her to have pain here for her kidney stones.  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 Patient denies any vaginal bleeding or vaginal discharge.  Past Medical History  Diagnosis Date  . UTI (lower urinary tract infection)     currently being tx - finished abx 02/15/11   . WUJWJXBJ(478.2)     imitrex - 02/11/11  . Anxiety     no meds  . GERD (gastroesophageal reflux disease)     diet controlled - no med  . Bacterial infection     currently - finishing meds 02/15/11  . Anemia   . Vitamin D deficiency   . Leukocytosis   . Vaginal yeast  infection     recurrent at least 5x/year.   . Hypertension   . Kidney stone    Past Surgical History  Procedure Laterality Date  . Cholecystectomy    . Tubal ligation    . Dental surgery      wisdom teeth ext  . Svd      x 3  . Colonscopy    . Cervical conization w/bx  02/25/2011    Procedure: CONIZATION CERVIX WITH BIOPSY;  Surgeon: Janine Limbo, MD;  Location: [facility];  Service: Gynecology;  Laterality: N/A;   Family History  Problem Relation Age of Onset  . Hypothyroidism Mother   . Thyroid disease Mother   . Hypertension Father   . Diabetes Father   . Cancer Maternal Grandmother     colon cancer   Social History  Substance Use Topics  . Smoking status: Former Smoker -- 0.25 packs/day for 15 years    Types: Cigarettes    Quit date: 05/23/2011  . Smokeless tobacco: Never Used  . Alcohol Use: 0.5 oz/week    1 drink(s) per week     Comment: socially   OB History    Gravida Para Term Preterm AB TAB SAB Ectopic Multiple Living   Review of Systems  Constitutional: Negative for fever and chills.  HENT: Negative for congestion and rhinorrhea.   Eyes: Negative  for redness and visual disturbance.  Respiratory: Negative for shortness of breath and wheezing.   Cardiovascular: Negative for chest pain and palpitations.  Gastrointestinal: Positive for abdominal pain. Negative for nausea, vomiting, diarrhea and anorexia.  Genitourinary: Positive for dysuria, urgency and frequency.  Musculoskeletal: Negative for myalgias and arthralgias.  Skin: Negative for pallor and wound.  Neurological: Negative for dizziness and headaches.      Allergies  Percocet  Home Medications   Prior to Admission medications   Medication Sig Start Date End Date Taking? Authorizing Provider  cholecalciferol (VITAMIN D) 1000 units tablet Take 1,000 Units by mouth daily.   Yes Historical Provider, MD  Cyanocobalamin (VITAMIN B12 PO) Take 1 tablet by mouth daily.   Yes  Historical Provider, MD  HYDROmorphone (DILAUDID) 4 MG tablet Take 4 mg by mouth every 4 (four) hours as needed. For pain 04/08/15  Yes Historical Provider, MD  hyoscyamine (LEVSIN, ANASPAZ) 0.125 MG tablet Take 0.125 mg by mouth as needed. Abdominal spasms 04/08/15 04/18/15 Yes Historical Provider, MD  ibuprofen (ADVIL,MOTRIN) 600 MG tablet Take 1 tablet (600 mg total) by mouth every 6 (six) hours as needed. 10/20/14  Yes Ozella Rocks, MD  lisinopril (PRINIVIL,ZESTRIL) 20 MG tablet Take 1 tablet by mouth daily. 04/08/15  Yes Historical Provider, MD  omeprazole (PRILOSEC) 20 MG capsule Take 1 capsule by mouth daily. 04/01/15  Yes Historical Provider, MD  promethazine (PHENERGAN) 25 MG tablet Take 1 tablet by mouth every 6 (six) hours. 04/01/15  Yes Historical Provider, MD  SUMAtriptan (IMITREX) 100 MG tablet Take 1 tablet by mouth as needed. migraine 03/03/15  Yes Historical Provider, MD  cephALEXin (KEFLEX) 500 MG capsule Take 1 capsule (500 mg total) by mouth 4 (four) times daily. 04/12/15   Melene Plan, DO  fluconazole (DIFLUCAN) 150 MG tablet Take 1 tablet (150 mg total) by mouth daily. Repeat dose in 3 days 10/20/14   Ozella Rocks, MD  metroNIDAZOLE (FLAGYL) 500 MG tablet Take 1 tablet (500 mg total) by mouth 2 (two) times daily. 04/12/15   Melene Plan, DO  tamsulosin (FLOMAX) 0.4 MG CAPS capsule Take 1 capsule (0.4 mg total) by mouth daily after breakfast. 10/20/14   Ozella Rocks, MD   BP 100/49 mmHg  Pulse 55  Temp(Src) 98.2 F (36.8 C) (Oral)  Resp 16  Ht  (1.626 m)  Wt 276 lb 4 oz (125.306 kg)  BMI 47.39 kg/m2  SpO2 97%  LMP 03/14/2015 (Exact Date) Physical Exam  Constitutional: She is oriented to person, place, and time. She appears well-developed and well-nourished. No distress.  HENT:  Head: Normocephalic and atraumatic.  Eyes: EOM are normal. Pupils are equal, round, and reactive to light.  Neck: Normal range of motion. Neck supple.  Cardiovascular: Normal rate and regular  rhythm.  Exam reveals no gallop and no friction rub.   No murmur heard. Pulmonary/Chest: Effort normal. She has no wheezes. She has no rales.  Abdominal: Soft. She exhibits no distension. There is tenderness (suprapubic). There is no rebound and no guarding.  Incisions clean dry and intact.  Mild erythema without drainage.   Genitourinary: Cervix exhibits discharge. Cervix exhibits no motion tenderness and no friability. Right adnexum displays no mass, no tenderness and no fullness. Left adnexum displays no mass, no tenderness and no fullness.  Musculoskeletal: She exhibits no edema or tenderness.  Neurological: She is alert and oriented to person, place, and time.  Skin: Skin is warm and dry. She is not diaphoretic.  Psychiatric: She has a normal mood and affect. Her behavior is normal.  Nursing note and vitals reviewed.   ED Course  Procedures (including critical care time) Labs Review Labs Reviewed  WET PREP, GENITAL - Abnormal; Notable for the following:    Clue Cells Wet Prep HPF POC PRESENT (*)    WBC, Wet Prep HPF POC MANY (*)    All other components within normal limits  LIPASE, BLOOD - Abnormal; Notable for the following:    Lipase 56 (*)    All other components within normal limits  COMPREHENSIVE METABOLIC PANEL - Abnormal; Notable for the following:    Albumin 3.2 (*)    All other components within normal limits  CBC - Abnormal; Notable for the following:    WBC 14.1 (*)    All other components within normal limits  URINALYSIS, ROUTINE W REFLEX MICROSCOPIC (NOT AT [facility]) - Abnormal; Notable for the following:    Color, Urine AMBER (*)    APPearance HAZY (*)    Hgb urine dipstick LARGE (*)    Bilirubin Urine MODERATE (*)    Ketones, ur >80 (*)    Protein, ur 100 (*)    Leukocytes, UA TRACE (*)    All other components within normal limits  URINE MICROSCOPIC-ADD ON - Abnormal; Notable for the following:    Squamous Epithelial / LPF 6-30 (*)    Bacteria, UA FEW (*)     All other components within normal limits  I-STAT BETA HCG BLOOD, ED ([facility], [facility], [facility] ONLY)  GC/CHLAMYDIA PROBE AMP ([place]) NOT AT [facility]    Imaging Review Ct Renal Stone Study  04/12/2015  CLINICAL DATA:  Lower abdominal pain, suprapubic pain. Prior gastric sleeve. EXAM: CT ABDOMEN AND PELVIS WITHOUT CONTRAST TECHNIQUE: Multidetector CT imaging of the abdomen and pelvis was performed following the standard protocol without IV contrast. COMPARISON:  11/12/2012 FINDINGS: Heart is borderline in size. No confluent airspace opacities or effusions. Prior cholecystectomy. Pneumobilia noted from presumed prior sphincterotomy. Changes of gastric sleeve noted. No focal abnormality visualized in the liver. Spleen, pancreas, adrenals have an unremarkable unenhanced appearance. Multiple nonobstructing bilateral renal stones, the largest in the left upper pole measuring 7 mm. Similarly sized left lower pole renal stone. Punctate nonobstructing stones in the lower pole of the right kidney. No hydronephrosis. No ureteral stones. Urinary bladder is unremarkable. Uterus, adnexae a have an unremarkable unenhanced appearance. Appendix is visualized and is normal. Large and small bowel are unremarkable. No free fluid, free air or adenopathy. No acute bony abnormality. IMPRESSION: Bilateral nephrolithiasis.  No ureteral stones or hydronephrosis. Prior gastric sleeve and cholecystectomy. Pneumobilia, presumably from prior sphincterotomy. Electronically Signed   By: Charlett Nose M.D.   On: 04/12/2015 13:28   I have personally reviewed and evaluated these images and lab results as part of my medical decision-making.   EKG Interpretation None      MDM   Final diagnoses:  Suprapubic pain  BV (bacterial vaginosis)  UTI (lower urinary tract infection)    35 yo F  With a chief complaint of suprapubic abdominal pain. Patient states is consistent with her kidney stone pain.  No noted documentation of an ED visit here for  same.  Strange location for nephrolithasis, will ct stone study.  UA, pelvic.   No noted stones in the ureter.  ? Uti, will teat with symptoms.  +BV will treat.    I have discussed the diagnosis/risks/treatment options with the patient and family and believe the  pt to be eligible for discharge home to follow-up with PCP. We also discussed returning to the ED immediately if new or worsening sx occur. We discussed the sx which are most concerning (e.g., sudden worsening pain, fever, inability to tolerate by mouth) that necessitate immediate return. Medications administered to the patient during their visit and any new prescriptions provided to the patient are listed below.  Medications given during this visit Medications  ketorolac (TORADOL) 30 MG/ML injection 30 mg (30 mg Intravenous Given 04/12/15 1244)  ondansetron (ZOFRAN) injection 4 mg (4 mg Intravenous Given 04/12/15 1243)    Discharge Medication List as of 04/12/2015  2:14 PM    START taking these medications   Details  metroNIDAZOLE (FLAGYL) 500 MG tablet Take 1 tablet (500 mg total) by mouth 2 (two) times daily., Starting 04/12/2015, Until Discontinued, Print        The patient appears reasonably screen and/or stabilized for discharge and I doubt any other medical condition or other [facility] requiring further screening, evaluation, or treatment in the ED at this time prior to discharge.      Melene Plan, DO 04/12/15 2019

## 2015-04-12 NOTE — Discharge Instructions (Signed)

## 2015-04-12 NOTE — ED Notes (Signed)
Pt from home with c/o lower abdominal pain that feels similar to kidney stones which pt has a hx of.  Pt had her stomach stapled on Tuesday.  Denies N/V, fever, or chills.  NAD, A&O.

## 2015-04-13 LAB — GC/CHLAMYDIA PROBE AMP (~~LOC~~) NOT AT ARMC
CHLAMYDIA, DNA PROBE: NEGATIVE
Neisseria Gonorrhea: NEGATIVE

## 2015-04-24 ENCOUNTER — Emergency Department (HOSPITAL_COMMUNITY)
Admission: EM | Admit: 2015-04-24 | Discharge: 2015-04-24 | Disposition: A | Discharge status: Home / Self Care | Payer: No Typology Code available for payment source | Attending: Emergency Medicine | Admitting: Emergency Medicine

## 2015-04-24 ENCOUNTER — Encounter (HOSPITAL_COMMUNITY): Payer: Self-pay | Admitting: *Deleted

## 2015-04-24 DIAGNOSIS — Z79899 Other long term (current) drug therapy: Secondary | ICD-10-CM | POA: Insufficient documentation

## 2015-04-24 DIAGNOSIS — Z87440 Personal history of urinary (tract) infections: Secondary | ICD-10-CM | POA: Insufficient documentation

## 2015-04-24 DIAGNOSIS — Z87442 Personal history of urinary calculi: Secondary | ICD-10-CM | POA: Insufficient documentation

## 2015-04-24 DIAGNOSIS — Y92410 Unspecified street and highway as the place of occurrence of the external cause: Secondary | ICD-10-CM | POA: Insufficient documentation

## 2015-04-24 DIAGNOSIS — S39012A Strain of muscle, fascia and tendon of lower back, initial encounter: Secondary | ICD-10-CM | POA: Diagnosis not present

## 2015-04-24 DIAGNOSIS — S3992XA Unspecified injury of lower back, initial encounter: Secondary | ICD-10-CM | POA: Diagnosis present

## 2015-04-24 DIAGNOSIS — K219 Gastro-esophageal reflux disease without esophagitis: Secondary | ICD-10-CM | POA: Diagnosis not present

## 2015-04-24 DIAGNOSIS — Z87891 Personal history of nicotine dependence: Secondary | ICD-10-CM | POA: Diagnosis not present

## 2015-04-24 DIAGNOSIS — Y998 Other external cause status: Secondary | ICD-10-CM | POA: Diagnosis not present

## 2015-04-24 DIAGNOSIS — Y9389 Activity, other specified: Secondary | ICD-10-CM | POA: Diagnosis not present

## 2015-04-24 DIAGNOSIS — Z862 Personal history of diseases of the blood and blood-forming organs and certain disorders involving the immune mechanism: Secondary | ICD-10-CM | POA: Diagnosis not present

## 2015-04-24 DIAGNOSIS — F419 Anxiety disorder, unspecified: Secondary | ICD-10-CM | POA: Diagnosis not present

## 2015-04-24 DIAGNOSIS — I10 Essential (primary) hypertension: Secondary | ICD-10-CM | POA: Insufficient documentation
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
## 2015-04-24 NOTE — Discharge Instructions (Signed)
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
## 2016-05-24 IMAGING — CT CT RENAL STONE PROTOCOL
2 of 4 series · 8 of 46 positions shown, 9 images · non-contrast
Comparison: 11/12/2012

CLINICAL DATA: Lower abdominal pain, suprapubic pain. Prior gastric
sleeve.

EXAM:
CT ABDOMEN AND PELVIS WITHOUT CONTRAST
TECHNIQUE: Multidetector CT imaging of the abdomen and pelvis was performed
following the standard protocol without IV contrast.

[Series 201: stone study, idose (2) · axial · 0.98mm/px · z∈[+52,+427]mm · 5 of 103 slices shown, 6 images]
[im 14/103  soft-tissue]
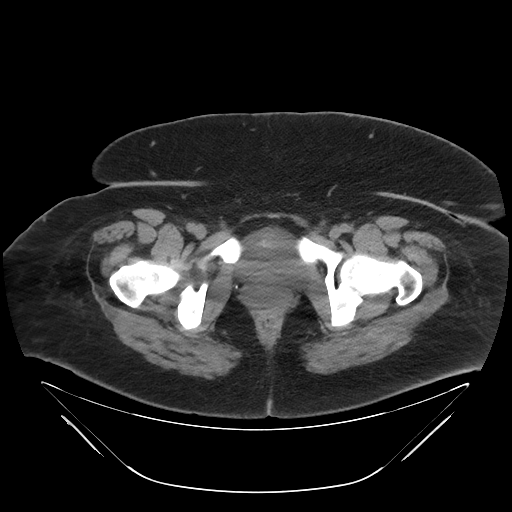
[im 14/103  bone]
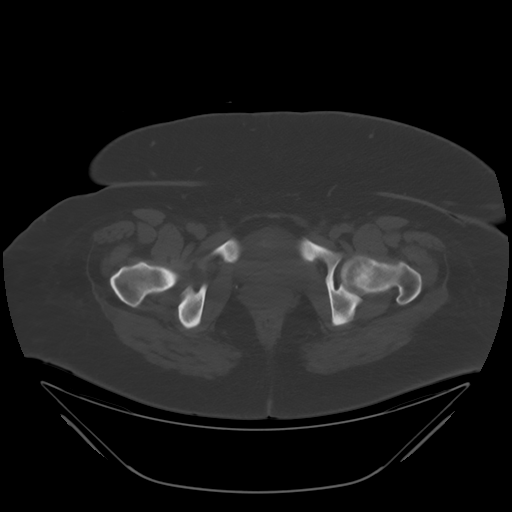
[im 32/103  soft-tissue]
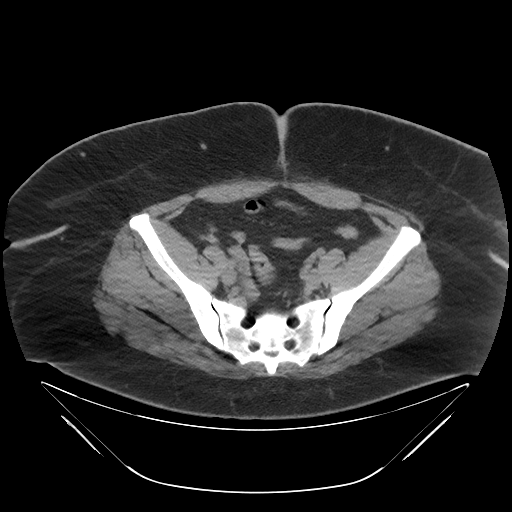
[im 54/103  soft-tissue]
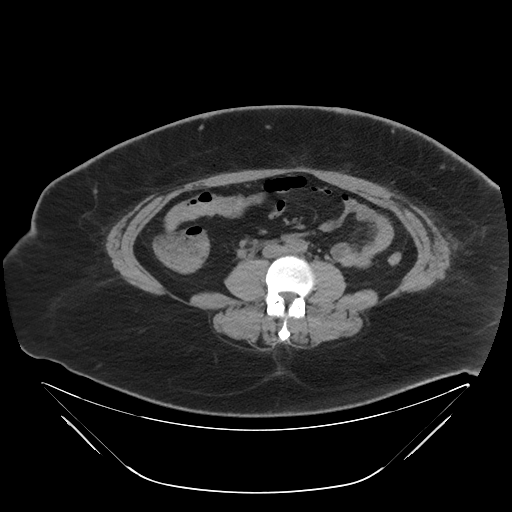
[im 71/103  soft-tissue]
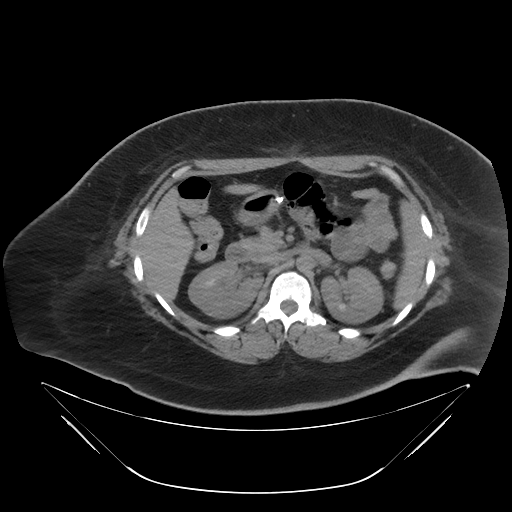
[im 89/103  soft-tissue]
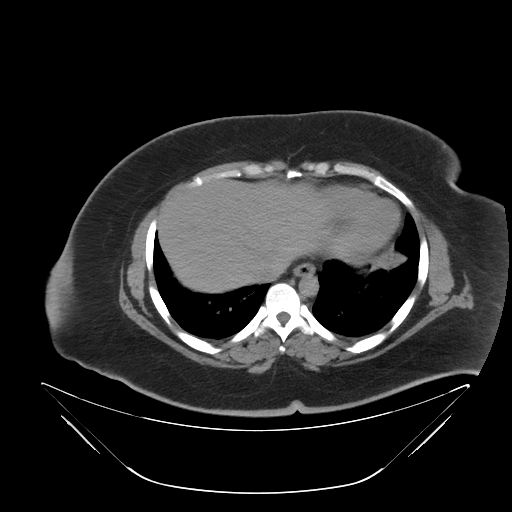

[Series 203: coronals, idose (2) · coronal · 0.45mm/px · 3 of 143 slices shown]
[im 48/143  soft-tissue]
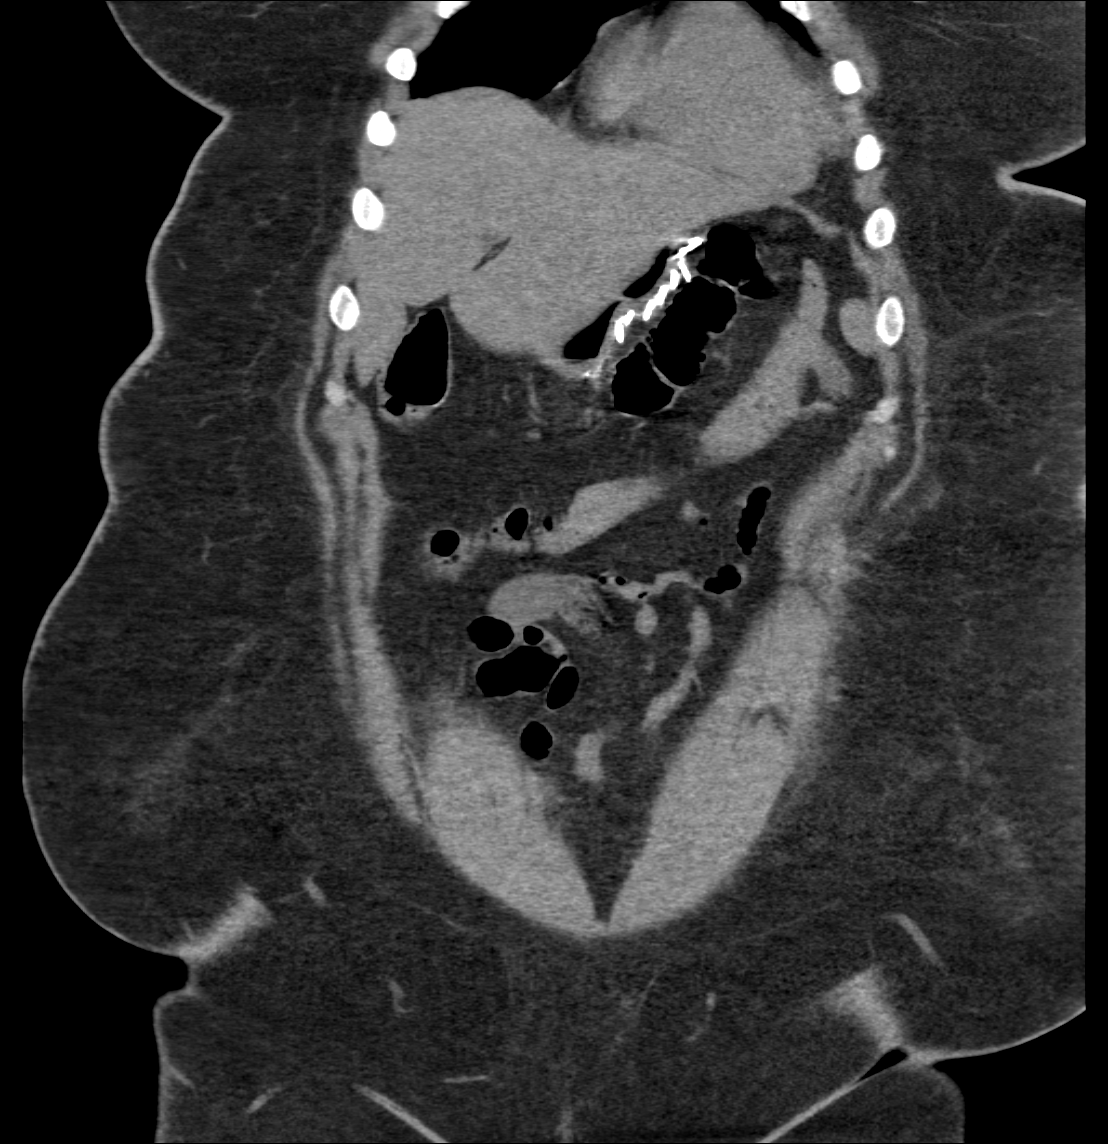
[im 64/143  soft-tissue]
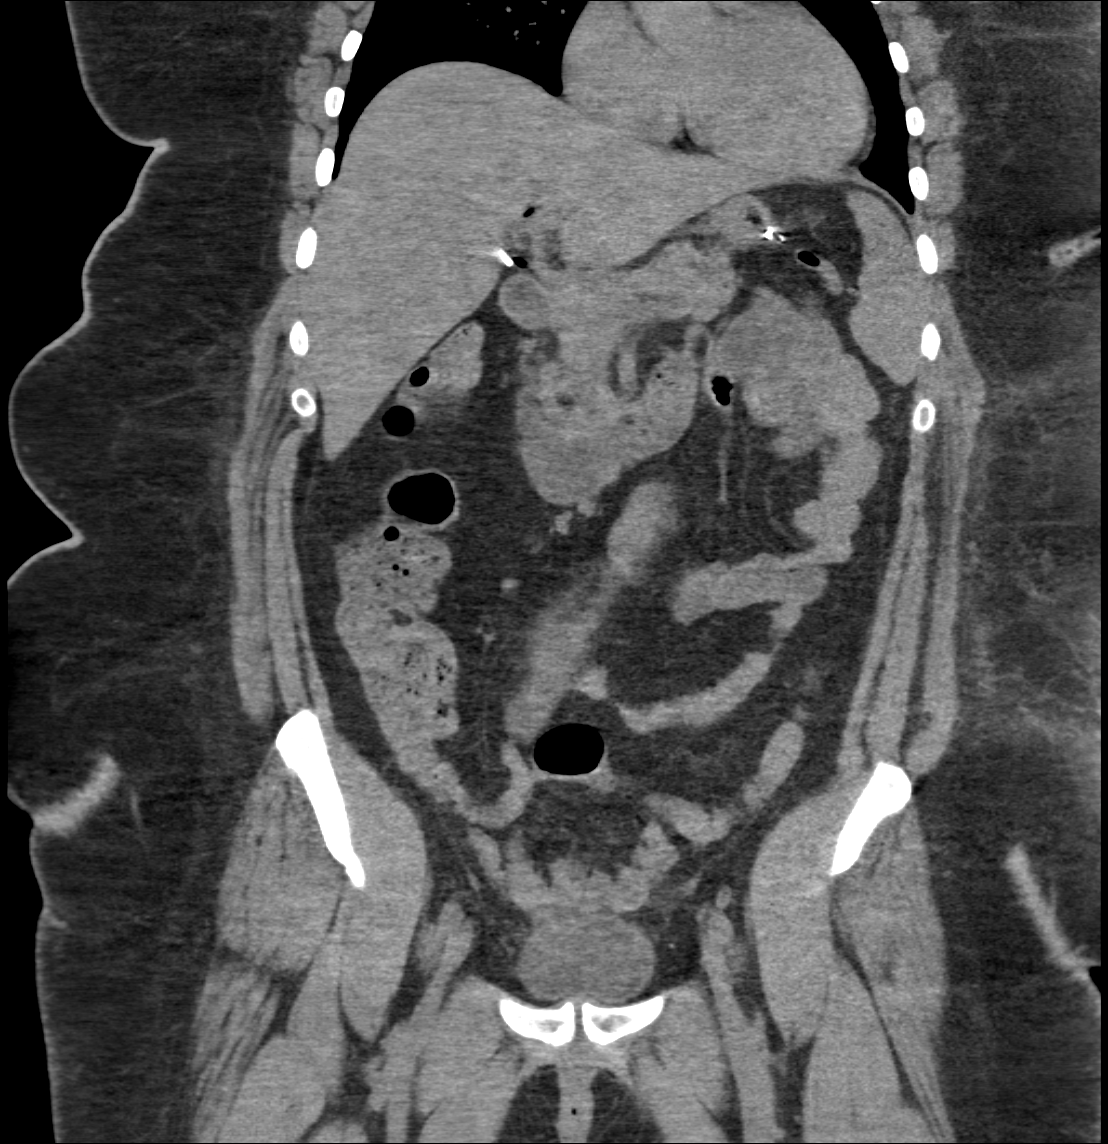
[im 79/143  soft-tissue]
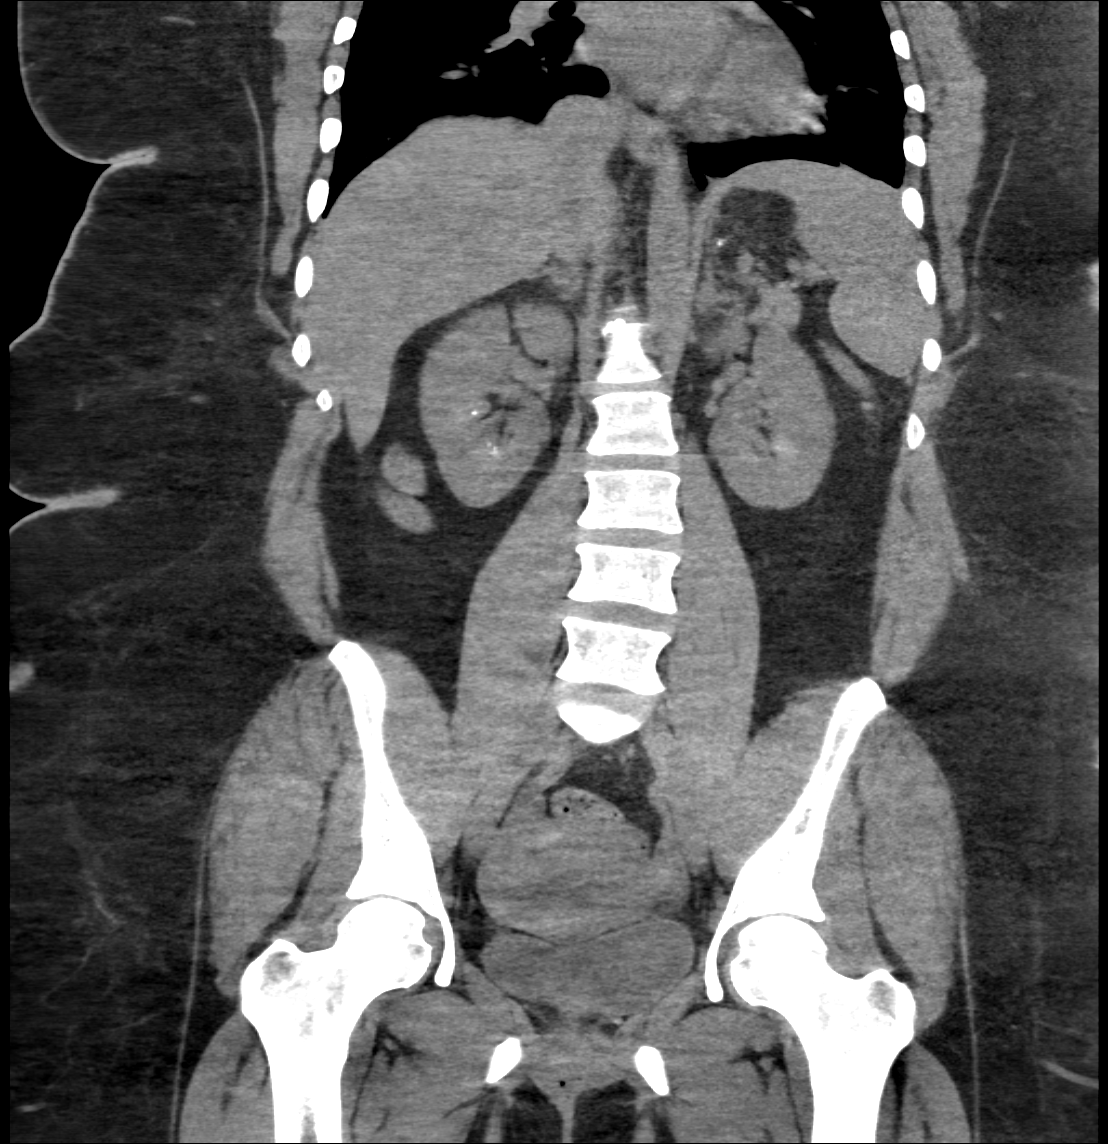

[8 of 46 positions shown; findings below may reference images not displayed]

FINDINGS: Heart is borderline in size. No confluent airspace opacities or
effusions.

Prior cholecystectomy. Pneumobilia noted from presumed prior
sphincterotomy. Changes of gastric sleeve noted. No focal
abnormality visualized in the liver. Spleen, pancreas, adrenals have
an unremarkable unenhanced appearance.

Multiple nonobstructing bilateral renal stones, the largest in the
left upper pole measuring 7 mm. Similarly sized left lower pole
renal stone. Punctate nonobstructing stones in the lower pole of the
right kidney. No hydronephrosis. No ureteral stones. Urinary bladder
is unremarkable.

Uterus, adnexae a have an unremarkable unenhanced appearance.
Appendix is visualized and is normal. Large and small bowel are
unremarkable. No free fluid, free air or adenopathy. No acute bony
abnormality.
IMPRESSION: Bilateral nephrolithiasis.  No ureteral stones or hydronephrosis.

Prior gastric sleeve and cholecystectomy.

Pneumobilia, presumably from prior sphincterotomy.
